# Patient Record
Sex: Female | Born: 1975 | Race: White | Hispanic: No | Marital: Married | Smoking: Former smoker
Health system: Southern US, Community
[De-identification: ages and names within clinical notes are randomized; demographics above are authoritative.]

## PROBLEM LIST (undated history)

## (undated) DIAGNOSIS — F419 Anxiety disorder, unspecified: Secondary | ICD-10-CM

## (undated) DIAGNOSIS — Z8616 Personal history of COVID-19: Secondary | ICD-10-CM

## (undated) DIAGNOSIS — I2510 Atherosclerotic heart disease of native coronary artery without angina pectoris: Secondary | ICD-10-CM

## (undated) DIAGNOSIS — Z789 Other specified health status: Secondary | ICD-10-CM

## (undated) DIAGNOSIS — I10 Essential (primary) hypertension: Secondary | ICD-10-CM

## (undated) HISTORY — DX: Personal history of COVID-19: Z86.16

## (undated) HISTORY — PX: CARPAL TUNNEL RELEASE: SHX101

## (undated) HISTORY — PX: TONSILLECTOMY: SUR1361

## (undated) HISTORY — DX: Atherosclerotic heart disease of native coronary artery without angina pectoris: I25.10

## (undated) HISTORY — DX: Anxiety disorder, unspecified: F41.9

## (undated) HISTORY — DX: Essential (primary) hypertension: I10

---

## 1999-10-29 ENCOUNTER — Other Ambulatory Visit: Admission: RE | Admit: 1999-10-29 | Discharge: 1999-10-29 | Payer: Self-pay | Admitting: Obstetrics and Gynecology

## 2001-02-23 ENCOUNTER — Emergency Department (HOSPITAL_COMMUNITY): Admission: EM | Admit: 2001-02-23 | Discharge: 2001-02-23 | Payer: Self-pay

## 2001-07-26 ENCOUNTER — Emergency Department (HOSPITAL_COMMUNITY): Admission: EM | Admit: 2001-07-26 | Discharge: 2001-07-27 | Payer: Self-pay

## 2001-09-13 ENCOUNTER — Emergency Department (HOSPITAL_COMMUNITY): Admission: EM | Admit: 2001-09-13 | Discharge: 2001-09-13 | Payer: Self-pay | Admitting: Emergency Medicine

## 2001-09-30 ENCOUNTER — Encounter: Payer: Self-pay | Admitting: Emergency Medicine

## 2001-09-30 ENCOUNTER — Emergency Department (HOSPITAL_COMMUNITY): Admission: EM | Admit: 2001-09-30 | Discharge: 2001-09-30 | Payer: Self-pay | Admitting: Emergency Medicine

## 2002-04-14 ENCOUNTER — Emergency Department (HOSPITAL_COMMUNITY): Admission: EM | Admit: 2002-04-14 | Discharge: 2002-04-14 | Payer: Self-pay | Admitting: Unknown Physician Specialty

## 2003-05-11 ENCOUNTER — Emergency Department (HOSPITAL_COMMUNITY): Admission: AD | Admit: 2003-05-11 | Discharge: 2003-05-11 | Payer: Self-pay | Admitting: Family Medicine

## 2003-12-30 ENCOUNTER — Emergency Department (HOSPITAL_COMMUNITY): Admission: EM | Admit: 2003-12-30 | Discharge: 2003-12-30 | Payer: Self-pay | Admitting: Emergency Medicine

## 2005-03-12 ENCOUNTER — Emergency Department (HOSPITAL_COMMUNITY): Admission: EM | Admit: 2005-03-12 | Discharge: 2005-03-12 | Payer: Self-pay | Admitting: Emergency Medicine

## 2007-08-03 ENCOUNTER — Ambulatory Visit (HOSPITAL_COMMUNITY): Admission: RE | Admit: 2007-08-03 | Discharge: 2007-08-03 | Payer: Self-pay | Admitting: Family Medicine

## 2008-11-13 ENCOUNTER — Ambulatory Visit (HOSPITAL_BASED_OUTPATIENT_CLINIC_OR_DEPARTMENT_OTHER): Admission: RE | Admit: 2008-11-13 | Discharge: 2008-11-13 | Payer: Self-pay | Admitting: Orthopedic Surgery

## 2009-08-14 ENCOUNTER — Emergency Department (HOSPITAL_COMMUNITY): Admission: EM | Admit: 2009-08-14 | Discharge: 2009-08-14 | Payer: Self-pay | Admitting: Dermatology

## 2010-06-10 ENCOUNTER — Ambulatory Visit (HOSPITAL_BASED_OUTPATIENT_CLINIC_OR_DEPARTMENT_OTHER): Admission: RE | Admit: 2010-06-10 | Discharge: 2010-06-10 | Payer: Self-pay | Admitting: Orthopedic Surgery

## 2010-09-30 LAB — POCT HEMOGLOBIN-HEMACUE: Hemoglobin: 12.9 g/dL (ref 12.0–15.0)

## 2010-12-02 NOTE — Op Note (Signed)
[name], [name]                ACCOUNT NO.:  [id number]   MEDICAL RECORD NO.:  [id number]          PATIENT TYPE:  AMB   LOCATION:  DSC                          FACILITY:  [facility]   PHYSICIAN:  Katy Fitch. Sypher, M.D. 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   OPERATION:  Release of left transverse carpal ligament.   SURGEON:  Katy Fitch. Sypher, MD   ASSISTANT:  None.   ANESTHESIA:  General by LMA.   SUPERVISING ANESTHESIOLOGIST:  Sheldon Silvan, MD   INDICATIONS:  Stephanie Harrison is a 35 year old woman referred for  evaluation of hand numbness.  Clinical examination revealed signs of  probable bilateral carpal tunnel syndrome and electrodiagnostic studies  in December 2009 confirmed bilateral carpal tunnel syndrome.   She has been treated in a nonoperative manner for more than 4 months  with activity modification, splinting and steroid injection.  Despite  our best conservative efforts, she still has significant entrapment  neuropathy symptoms.  She now presents for release of her left  transverse carpal ligament.   Preoperatively, she was advised of the potential risks and benefits of  surgery.  She is brought to the operating room at this time.   PROCEDURE:  Stephanie Harrison is brought to the operating room and placed in  supine position upon operating table.   Following the induction of general anesthesia by LMA technique, the left  arm was prepped with Betadine soap solution and sterilely draped.  Upon  exsanguination of the left arm with an Esmarch bandage the arterial  tourniquet was inflated to 230 mmHg.  The procedure commenced with a  short incision in the line of ring finger and the palm.  Subcutaneous  tissues were  carefully divided revealing the palmar fascia.  This was  split longitudinally to reveal the common sensory branch of the median  nerve.  These were followed back to the transverse carpal ligament which  was gently isolated from the median nerve.  The ligament was then  released with scissors along its ulnar border extending into the distal  forearm.  This widely opened the carpal canal.  The palmaris longus was  left intact.   Bleeding points along the margin of the released ligament were  electrocauterized with bipolar current followed by repair of the skin  with intradermal 3-0 Prolene suture.  Lidocaine 2% was infiltrated for  postoperative analgesia.  Steri-Strips were applied to the wound.  The  wound was then dressed with sterile gauze, sterile Webril and a volar  plaster splint was applied maintaining the wrist in 5 degrees of  dorsiflexion.   There were no apparent complications.   Stephanie Harrison tolerated the surgery and anesthesia well.  She was  transferred to the recovery room with stable signs.      Katy Fitch Sypher, M.D.  Electronically Signed     RVS/MEDQ  D:  11/13/2008  T:  11/13/2008  Job:  [id number]

## 2011-02-09 ENCOUNTER — Emergency Department (HOSPITAL_COMMUNITY)
Admission: EM | Admit: 2011-02-09 | Discharge: 2011-02-09 | Discharge status: Against Medical Advice | Payer: BC Managed Care – PPO | Attending: Emergency Medicine | Admitting: Emergency Medicine

## 2011-02-09 DIAGNOSIS — W269XXA Contact with unspecified sharp object(s), initial encounter: Secondary | ICD-10-CM | POA: Insufficient documentation

## 2011-02-09 DIAGNOSIS — S61209A Unspecified open wound of unspecified finger without damage to nail, initial encounter: Secondary | ICD-10-CM | POA: Insufficient documentation

## 2012-02-04 ENCOUNTER — Other Ambulatory Visit: Payer: Self-pay | Admitting: Nurse Practitioner

## 2012-02-04 DIAGNOSIS — N920 Excessive and frequent menstruation with regular cycle: Secondary | ICD-10-CM

## 2012-02-09 ENCOUNTER — Ambulatory Visit (HOSPITAL_COMMUNITY)
Admission: RE | Admit: 2012-02-09 | Discharge: 2012-02-09 | Disposition: A | Discharge status: Home / Self Care | Payer: BC Managed Care – PPO | Source: Ambulatory Visit | Attending: Nurse Practitioner | Admitting: Nurse Practitioner

## 2012-02-09 DIAGNOSIS — N920 Excessive and frequent menstruation with regular cycle: Secondary | ICD-10-CM

## 2012-02-09 DIAGNOSIS — N949 Unspecified condition associated with female genital organs and menstrual cycle: Secondary | ICD-10-CM | POA: Insufficient documentation

## 2012-02-09 DIAGNOSIS — N938 Other specified abnormal uterine and vaginal bleeding: Secondary | ICD-10-CM | POA: Insufficient documentation

## 2012-03-09 ENCOUNTER — Other Ambulatory Visit: Payer: Self-pay | Admitting: Obstetrics & Gynecology

## 2012-03-25 ENCOUNTER — Encounter (HOSPITAL_COMMUNITY): Payer: Self-pay | Admitting: Pharmacist

## 2012-04-04 ENCOUNTER — Inpatient Hospital Stay (HOSPITAL_COMMUNITY): Admission: RE | Admit: 2012-04-04 | Payer: BC Managed Care – PPO | Source: Ambulatory Visit

## 2012-04-05 ENCOUNTER — Encounter (HOSPITAL_COMMUNITY): Payer: Self-pay

## 2012-04-05 ENCOUNTER — Encounter (HOSPITAL_COMMUNITY)
Admission: RE | Admit: 2012-04-05 | Discharge: 2012-04-05 | Disposition: A | Discharge status: Home / Self Care | Payer: BC Managed Care – PPO | Source: Ambulatory Visit | Attending: Obstetrics & Gynecology | Admitting: Obstetrics & Gynecology

## 2012-04-05 HISTORY — DX: Other specified health status: Z78.9

## 2012-04-05 LAB — CBC
Hemoglobin: 12.3 g/dL (ref 12.0–15.0)
MCH: 29.6 pg (ref 26.0–34.0)
MCHC: 33.3 g/dL (ref 30.0–36.0)

## 2012-04-05 LAB — SURGICAL PCR SCREEN: MRSA, PCR: NEGATIVE

## 2012-04-05 NOTE — Patient Instructions (Signed)
Your procedure is scheduled on:04/08/12  Enter through the Main Entrance at :1130 am Pick up desk phone and dial [phone] and inform us of your arrival.  Please call [phone] if you have any problems the morning of surgery.  Remember: Do not eat after midnight:Thursday Do not drink after: 9am Fri  Take these meds the morning of surgery with a sip of water:none  DO NOT wear jewelry, eye make-up, lipstick,body lotion, or dark fingernail polish. Do not shave for 48 hours prior to surgery.  If you are to be admitted after surgery, leave suitcase in car until your room has been assigned. Patients discharged on the day of surgery will not be allowed to drive home.   Remember to use your Hibiclens as instructed.

## 2012-04-08 ENCOUNTER — Encounter (HOSPITAL_COMMUNITY): Payer: Self-pay | Admitting: Anesthesiology

## 2012-04-08 ENCOUNTER — Ambulatory Visit (HOSPITAL_COMMUNITY)
Admission: RE | Admit: 2012-04-08 | Discharge: 2012-04-08 | Disposition: A | Discharge status: Home / Self Care | Payer: BC Managed Care – PPO | Source: Ambulatory Visit | Attending: Obstetrics & Gynecology | Admitting: Obstetrics & Gynecology

## 2012-04-08 ENCOUNTER — Ambulatory Visit (HOSPITAL_COMMUNITY): Payer: BC Managed Care – PPO | Admitting: Anesthesiology

## 2012-04-08 ENCOUNTER — Encounter (HOSPITAL_COMMUNITY)
Admission: RE | Disposition: A | Discharge status: Home / Self Care | Payer: Self-pay | Source: Ambulatory Visit | Attending: Obstetrics & Gynecology

## 2012-04-08 DIAGNOSIS — N949 Unspecified condition associated with female genital organs and menstrual cycle: Secondary | ICD-10-CM | POA: Insufficient documentation

## 2012-04-08 DIAGNOSIS — N939 Abnormal uterine and vaginal bleeding, unspecified: Secondary | ICD-10-CM | POA: Diagnosis present

## 2012-04-08 DIAGNOSIS — N938 Other specified abnormal uterine and vaginal bleeding: Secondary | ICD-10-CM | POA: Insufficient documentation

## 2012-04-08 DIAGNOSIS — Z01818 Encounter for other preprocedural examination: Secondary | ICD-10-CM | POA: Insufficient documentation

## 2012-04-08 DIAGNOSIS — Z302 Encounter for sterilization: Secondary | ICD-10-CM | POA: Insufficient documentation

## 2012-04-08 DIAGNOSIS — Z641 Problems related to multiparity: Secondary | ICD-10-CM | POA: Insufficient documentation

## 2012-04-08 DIAGNOSIS — Z01812 Encounter for preprocedural laboratory examination: Secondary | ICD-10-CM | POA: Insufficient documentation

## 2012-04-08 HISTORY — PX: LAPAROSCOPIC TUBAL LIGATION: SHX1937

## 2012-04-08 SURGERY — LIGATION, FALLOPIAN TUBE, LAPAROSCOPIC
Anesthesia: General | Site: Uterus | Wound class: Clean Contaminated

## 2012-04-08 MED ORDER — ROCURONIUM BROMIDE 100 MG/10ML IV SOLN
INTRAVENOUS | Status: DC | PRN
  Administered 2012-04-08: 15 mg via INTRAVENOUS
  Administered 2012-04-08: 35 mg via INTRAVENOUS

## 2012-04-08 MED ORDER — NEOSTIGMINE METHYLSULFATE 1 MG/ML IJ SOLN
INTRAMUSCULAR | Status: AC
  Filled 2012-04-08: qty 10

## 2012-04-08 MED ORDER — LACTATED RINGERS IV SOLN
INTRAVENOUS | Status: DC | PRN
  Administered 2012-04-08: 500 mL via INTRAUTERINE

## 2012-04-08 MED ORDER — ROCURONIUM BROMIDE 50 MG/5ML IV SOLN
INTRAVENOUS | Status: AC
  Filled 2012-04-08: qty 1

## 2012-04-08 MED ORDER — LIDOCAINE HCL (CARDIAC) 20 MG/ML IV SOLN
INTRAVENOUS | Status: AC
  Filled 2012-04-08: qty 5

## 2012-04-08 MED ORDER — MIDAZOLAM HCL 5 MG/5ML IJ SOLN
INTRAMUSCULAR | Status: DC | PRN
  Administered 2012-04-08: 2 mg via INTRAVENOUS

## 2012-04-08 MED ORDER — ONDANSETRON HCL 4 MG/2ML IJ SOLN: 4.0000 mg | Freq: Four times a day (QID) | INTRAMUSCULAR | Status: DC | PRN

## 2012-04-08 MED ORDER — PROPOFOL 10 MG/ML IV EMUL
INTRAVENOUS | Status: AC
  Filled 2012-04-08: qty 20

## 2012-04-08 MED ORDER — FENTANYL CITRATE 0.05 MG/ML IJ SOLN
INTRAMUSCULAR | Status: AC
  Filled 2012-04-08: qty 5

## 2012-04-08 MED ORDER — DEXAMETHASONE SODIUM PHOSPHATE 4 MG/ML IJ SOLN
INTRAMUSCULAR | Status: DC | PRN
  Administered 2012-04-08: 10 mg via INTRAVENOUS

## 2012-04-08 MED ORDER — DEXAMETHASONE SODIUM PHOSPHATE 4 MG/ML IJ SOLN: INTRAMUSCULAR | Status: DC | PRN

## 2012-04-08 MED ORDER — FENTANYL CITRATE 0.05 MG/ML IJ SOLN
INTRAMUSCULAR | Status: DC | PRN
  Administered 2012-04-08 (×7): 50 ug via INTRAVENOUS

## 2012-04-08 MED ORDER — KETOROLAC TROMETHAMINE 30 MG/ML IJ SOLN
INTRAMUSCULAR | Status: DC | PRN
  Administered 2012-04-08: 30 mg via INTRAVENOUS

## 2012-04-08 MED ORDER — ACETAMINOPHEN 650 MG RE SUPP
650.0000 mg | RECTAL | Status: DC | PRN
  Filled 2012-04-08: qty 1

## 2012-04-08 MED ORDER — ONDANSETRON HCL 4 MG/2ML IJ SOLN
INTRAMUSCULAR | Status: AC
  Filled 2012-04-08: qty 2

## 2012-04-08 MED ORDER — LACTATED RINGERS IV SOLN
INTRAVENOUS | Status: DC
  Administered 2012-04-08 (×4): via INTRAVENOUS

## 2012-04-08 MED ORDER — PROPOFOL 10 MG/ML IV EMUL
INTRAVENOUS | Status: DC | PRN
  Administered 2012-04-08: 50 mg via INTRAVENOUS
  Administered 2012-04-08: 200 mg via INTRAVENOUS

## 2012-04-08 MED ORDER — KETOROLAC TROMETHAMINE 30 MG/ML IJ SOLN
INTRAMUSCULAR | Status: AC
  Filled 2012-04-08: qty 1

## 2012-04-08 MED ORDER — FENTANYL CITRATE 0.05 MG/ML IJ SOLN
INTRAMUSCULAR | Status: AC
  Filled 2012-04-08: qty 2

## 2012-04-08 MED ORDER — FENTANYL CITRATE 0.05 MG/ML IJ SOLN
INTRAMUSCULAR | Status: AC
  Administered 2012-04-08: 50 ug via INTRAVENOUS
  Filled 2012-04-08: qty 2

## 2012-04-08 MED ORDER — OXYCODONE-ACETAMINOPHEN 5-325 MG PO TABS: 2.0000 | ORAL_TABLET | Freq: Four times a day (QID) | ORAL | Status: DC | PRN

## 2012-04-08 MED ORDER — LIDOCAINE HCL (CARDIAC) 20 MG/ML IV SOLN
INTRAVENOUS | Status: DC | PRN
  Administered 2012-04-08 (×2): 20 mg via INTRAVENOUS
  Administered 2012-04-08: 30 mg via INTRAVENOUS

## 2012-04-08 MED ORDER — ACETAMINOPHEN 325 MG PO TABS: 650.0000 mg | ORAL_TABLET | ORAL | Status: DC | PRN

## 2012-04-08 MED ORDER — GLYCOPYRROLATE 0.2 MG/ML IJ SOLN
INTRAMUSCULAR | Status: AC
  Filled 2012-04-08: qty 1

## 2012-04-08 MED ORDER — OXYCODONE HCL 5 MG PO TABS: 5.0000 mg | ORAL_TABLET | ORAL | Status: DC | PRN

## 2012-04-08 MED ORDER — LIDOCAINE HCL 1 % IJ SOLN
INTRAMUSCULAR | Status: DC | PRN
  Administered 2012-04-08: 10 mL

## 2012-04-08 MED ORDER — BUPIVACAINE HCL (PF) 0.25 % IJ SOLN
INTRAMUSCULAR | Status: AC
  Filled 2012-04-08: qty 30

## 2012-04-08 MED ORDER — FENTANYL CITRATE 0.05 MG/ML IJ SOLN: 25.0000 ug | INTRAMUSCULAR | Status: DC | PRN

## 2012-04-08 MED ORDER — ONDANSETRON HCL 4 MG/2ML IJ SOLN
INTRAMUSCULAR | Status: DC | PRN
  Administered 2012-04-08: 4 mg via INTRAVENOUS

## 2012-04-08 MED ORDER — NEOSTIGMINE METHYLSULFATE 1 MG/ML IJ SOLN
INTRAMUSCULAR | Status: DC | PRN
  Administered 2012-04-08: 4 mg via INTRAVENOUS

## 2012-04-08 MED ORDER — GLYCOPYRROLATE 0.2 MG/ML IJ SOLN
INTRAMUSCULAR | Status: DC | PRN
  Administered 2012-04-08: 0.6 mg via INTRAVENOUS

## 2012-04-08 MED ORDER — ESMOLOL HCL 10 MG/ML IV SOLN
INTRAVENOUS | Status: AC
  Filled 2012-04-08: qty 10

## 2012-04-08 MED ORDER — SODIUM CHLORIDE 0.9 % IJ SOLN: 3.0000 mL | INTRAMUSCULAR | Status: DC | PRN

## 2012-04-08 MED ORDER — MIDAZOLAM HCL 2 MG/2ML IJ SOLN
INTRAMUSCULAR | Status: AC
  Filled 2012-04-08: qty 2

## 2012-04-08 MED ORDER — OXYCODONE-ACETAMINOPHEN 5-325 MG PO TABS
ORAL_TABLET | ORAL | Status: AC
  Administered 2012-04-08: 1
  Filled 2012-04-08: qty 1

## 2012-04-08 MED ORDER — FENTANYL CITRATE 0.05 MG/ML IJ SOLN
25.0000 ug | INTRAMUSCULAR | Status: DC | PRN
  Administered 2012-04-08 (×4): 50 ug via INTRAVENOUS

## 2012-04-08 MED ORDER — BUPIVACAINE HCL (PF) 0.25 % IJ SOLN
INTRAMUSCULAR | Status: DC | PRN
  Administered 2012-04-08: 4 mL

## 2012-04-08 MED ORDER — DEXAMETHASONE SODIUM PHOSPHATE 10 MG/ML IJ SOLN
INTRAMUSCULAR | Status: AC
  Filled 2012-04-08: qty 1

## 2012-04-08 SURGICAL SUPPLY — 27 items
ABLATOR ENDOMETRIAL BIPOLAR (ABLATOR) ×3 IMPLANT
ADH SKN CLS APL DERMABOND .7 (GAUZE/BANDAGES/DRESSINGS) ×2
CANISTER SUCTION 2500CC (MISCELLANEOUS) ×3 IMPLANT
CATH ROBINSON RED A/P 16FR (CATHETERS) ×3 IMPLANT
CHLORAPREP W/TINT 26ML (MISCELLANEOUS) ×3 IMPLANT
CLOTH BEACON ORANGE TIMEOUT ST (SAFETY) ×3 IMPLANT
CONTAINER PREFILL 10% NBF 60ML (FORM) ×6 IMPLANT
COVER TABLE BACK 60X90 (DRAPES) ×1 IMPLANT
DERMABOND ADVANCED (GAUZE/BANDAGES/DRESSINGS) ×1
DERMABOND ADVANCED .7 DNX12 (GAUZE/BANDAGES/DRESSINGS) ×2 IMPLANT
DRESSING TELFA 8X3 (GAUZE/BANDAGES/DRESSINGS) ×3 IMPLANT
GLOVE BIO SURGEON STRL SZ 6.5 (GLOVE) ×6 IMPLANT
GOWN PREVENTION PLUS LG XLONG (DISPOSABLE) ×6 IMPLANT
NDL SPNL 20GX3.5 QUINCKE YW (NEEDLE) IMPLANT
NEEDLE SPNL 20GX3.5 QUINCKE YW (NEEDLE) ×3 IMPLANT
PACK LAPAROSCOPY BASIN (CUSTOM PROCEDURE TRAY) ×3 IMPLANT
PAD OB MATERNITY 4.3X12.25 (PERSONAL CARE ITEMS) ×3 IMPLANT
SUT VIC AB 3-0 PS2 18 (SUTURE)
SUT VIC AB 3-0 PS2 18XBRD (SUTURE) IMPLANT
SUT VICRYL 0 UR6 27IN ABS (SUTURE) ×2 IMPLANT
SYR CONTROL 10ML LL (SYRINGE) ×1 IMPLANT
TOWEL OR 17X24 6PK STRL BLUE (TOWEL DISPOSABLE) ×7 IMPLANT
TROCAR BALLN 12MMX100 BLUNT (TROCAR) ×1 IMPLANT
TROCAR BALLN GELPORT 12X130M (ENDOMECHANICALS) ×1 IMPLANT
TROCAR XCEL NON-BLD 11X100MML (ENDOMECHANICALS) ×3 IMPLANT
TUBING HYDROFLEX HYSTEROSCOPY (TUBING) ×1 IMPLANT
WATER STERILE IRR 1000ML POUR (IV SOLUTION) ×3 IMPLANT

## 2012-04-08 NOTE — Anesthesia Preprocedure Evaluation (Signed)

## 2012-04-08 NOTE — H&P (Signed)
  Chief Complaint: 36 y.o.  who presents for a laparoscopic BTL/Novasure ablation  Details of Present Illness: The patient gives a h/o AUB.  BP 151/96  Pulse 82  Temp 98.1 F (36.7 C) (Oral)  Resp 18  Ht 5\' 8"  (1.727 m)  Wt 118.389 kg (261 lb)  BMI 39.68 kg/m2  SpO2 100%  Past Medical History  Diagnosis Date  . No pertinent past medical history    History   Social History  . Marital Status: Married    Spouse Name: N/A    Number of Children: N/A  . Years of Education: N/A   Occupational History  . Not on file.   Social History Main Topics  . Smoking status: Current Every Day Smoker -- 0.2 packs/day    Types: Cigarettes  . Smokeless tobacco: Not on file  . Alcohol Use: Yes     socially  . Drug Use: No  . Sexually Active:    Other Topics Concern  . Not on file   Social History Narrative  . No narrative on file   No family history on file.  Pertinent items are noted in HPI.  Pre-Op Diagnosis: desire sterilization;aub   Planned Procedure: Procedure(s): LAPAROSCOPIC TUBAL LIGATION DILATATION & CURETTAGE/HYSTEROSCOPY WITH NOVASURE ABLATION  I have reviewed the patient's history and have completed the physical exam and Stephanie Harrison is acceptable for surgery.  Stephanie Rainbow, MD 04/08/2012 12:48 PM

## 2012-04-08 NOTE — Op Note (Signed)
Procedure Note  Stephanie Harrison 36 y.o. 04/08/2012  Preoperative Diagnosis:  Multiparity, desires a sterilization procedure; history of abnormal uterine bleeding  Postoperative Diagnosis: Same  Procedure: Laparoscopic bilateral tubal ligation with fulguration; Novasure endometrial ablation with dilatation and curettage, hysteroscopy  Surgeon: Antionette Char A  Indications:  The patient now presents for a sterilization/endometrial ablation procedure(s) after discussing therapeutic alternatives.        Procedure Detail:  The patient was taken to the operating room and was placed on the operating table in the dorsal supine position.  After his satisfactory general anesthesia was achieved, the patient was placed in the semi-lithotomy position using Allen stirrups. The patient was prepped and draped in the usual sterile manner for vaginal laparoscopic procedure. A speculum was placed in the vagina. The anterior lip of the cervix was grasped with a single-tooth tenaculum. A Hulka manipulator was then advanced into the uterus and secured  to the anterior lip of the cervix as a means to manipulate the uterus. The single-tooth tenaculum and Hulka manipulator were then removed. The infraumbilical region was then anesthetized with local anesthesia, 0.25%  Marcaine. A small incision was made to the skin and subcutaneous tissue. An attempt was made to introduce a  10 mm Optiview trocar into the abdominal cavity.  As this was unsuccessful, a Hassan trocar was placed through the incision into the abdominal cavity.  A diagnostic laparoscope with video camera attached was placed through the trocar sleeve and carbon dioxide was used to insufflate the abdominal and pelvic cavity. The pelvic contents were examined.  The tubes, uterus and right ovary were normal in appearance.  A left-sided, 2- 3 cm, smooth-walled, ovarian cyst was noted.  Kleppinger bipolar forceps were inserted through the operative port of the  laparoscope. The left fallopian tube was identified and traced out to its fimbriated end. Then starting at the distal isthmus the proximal ampullary portion of the tube was coagulated in 4 contiguous areas. Each time the resistance meter went to 0 and the tube had been retracted away from an adjacent viscera. The right fallopian tube was then manipulated in a similar fashion. The scope was removed.   A speculum was placed in the vagina.  The anterior lip of the cervix was grasped with a single-toothed tenaculum.  The endocervical canal sounded to 4 cm.  The uterine cavity sounded to 8 cm.  The endocervical canal was dilated with Shawnie Pons dilators.  A 5 mm diagnostic hysteroscope with Glycine as the distending medium was used to perform a diagnostic hysteroscopy.  The hysteroscope was removed.  A small, Sims curette was used to perform an endometrial curettage.  The cervical canal was further dilated with Pratt dilators to a #29.  The Novasure device was test-fired and the meter went between 4.5 and 5.  The device was withdrawn into the cylinder and placed into the uterus.  The dorsal fin was set appropriately.  The device was deployed and then seated.  The sleeve was placed up against the endocervix.  A cavity assessment test was performed and passed.  The ablation cycle was completed.  The device was removed and another hysteroscopic examination was performed.  A good result was noted.  All the instruments were removed from the vagina.  Attention was turned back to the abdomen. The excess carbon dioxide was remove through the port, before it was removed.  The subcutaneous layer of the incision was reapproximated with an interrupted figure-of eight 0-Vicryl suture on a UR 6 needle. The  skin was reapproximated with running subcuticluar stitches of 4-0 Vicryl suture. Final sponge, instrument and needle counts were correct. The patient was awakened on the operating table and taken to the PACU in satisfactory  condition.    Findings:  See above.  Visualization at hysteroscopy was very limited.

## 2012-04-08 NOTE — Transfer of Care (Signed)
Immediate Anesthesia Transfer of Care Note  Patient: Stephanie Harrison  Procedure(s) Performed: Procedure(s) (LRB) with comments: LAPAROSCOPIC TUBAL LIGATION (Bilateral) DILATATION & CURETTAGE/HYSTEROSCOPY WITH NOVASURE ABLATION (N/A)  Patient Location: PACU  Anesthesia Type: General  Level of Consciousness: awake  Airway & Oxygen Therapy: Patient Spontanous Breathing and Patient connected to nasal cannula oxygen  Post-op Assessment: Report given to PACU RN and Post -op Vital signs reviewed and stable  Post vital signs: stable  Complications: No apparent anesthesia complications

## 2012-04-08 NOTE — Anesthesia Postprocedure Evaluation (Signed)
  Anesthesia Post-op Note  Patient: Stephanie Harrison  Procedure(s) Performed: Procedure(s) (LRB) with comments: LAPAROSCOPIC TUBAL LIGATION (Bilateral) DILATATION & CURETTAGE/HYSTEROSCOPY WITH NOVASURE ABLATION (N/A)  Patient Location: PACU  Anesthesia Type: General  Level of Consciousness: awake, alert  and oriented  Airway and Oxygen Therapy: Patient Spontanous Breathing  Post-op Pain: mild  Post-op Assessment: Post-op Vital signs reviewed, Patient's Cardiovascular Status Stable, Respiratory Function Stable, Patent Airway, No signs of Nausea or vomiting and Pain level controlled  Post-op Vital Signs: Reviewed and stable  Complications: No apparent anesthesia complications

## 2012-04-10 ENCOUNTER — Encounter (HOSPITAL_COMMUNITY): Payer: Self-pay | Admitting: Obstetrics & Gynecology

## 2014-01-04 ENCOUNTER — Other Ambulatory Visit: Payer: Self-pay

## 2014-01-04 NOTE — Telephone Encounter (Signed)
error 

## 2014-06-12 ENCOUNTER — Other Ambulatory Visit: Payer: Self-pay | Admitting: Family Medicine

## 2014-06-12 DIAGNOSIS — R10813 Right lower quadrant abdominal tenderness: Secondary | ICD-10-CM

## 2014-06-20 ENCOUNTER — Ambulatory Visit
Admission: RE | Admit: 2014-06-20 | Discharge: 2014-06-20 | Disposition: A | Discharge status: Home / Self Care | Payer: BC Managed Care – PPO | Source: Ambulatory Visit | Attending: Family Medicine | Admitting: Family Medicine

## 2014-06-20 DIAGNOSIS — R10813 Right lower quadrant abdominal tenderness: Secondary | ICD-10-CM

## 2014-06-20 MED ORDER — IOHEXOL 300 MG/ML  SOLN
125.0000 mL | Freq: Once | INTRAMUSCULAR | Status: AC | PRN
  Administered 2014-06-20: 125 mL via INTRAVENOUS

## 2016-02-19 ENCOUNTER — Other Ambulatory Visit: Payer: Self-pay | Admitting: Family Medicine

## 2016-02-19 DIAGNOSIS — Z1231 Encounter for screening mammogram for malignant neoplasm of breast: Secondary | ICD-10-CM

## 2016-02-26 ENCOUNTER — Other Ambulatory Visit: Payer: Self-pay | Admitting: Family Medicine

## 2016-02-26 DIAGNOSIS — M5440 Lumbago with sciatica, unspecified side: Secondary | ICD-10-CM

## 2016-02-26 DIAGNOSIS — M47816 Spondylosis without myelopathy or radiculopathy, lumbar region: Secondary | ICD-10-CM

## 2016-02-28 ENCOUNTER — Ambulatory Visit
Admission: RE | Admit: 2016-02-28 | Discharge: 2016-02-28 | Disposition: A | Discharge status: Home / Self Care | Payer: Managed Care, Other (non HMO) | Source: Ambulatory Visit | Attending: Family Medicine | Admitting: Family Medicine

## 2016-02-28 DIAGNOSIS — Z1231 Encounter for screening mammogram for malignant neoplasm of breast: Secondary | ICD-10-CM

## 2016-03-06 ENCOUNTER — Ambulatory Visit
Admission: RE | Admit: 2016-03-06 | Discharge: 2016-03-06 | Disposition: A | Discharge status: Home / Self Care | Payer: Managed Care, Other (non HMO) | Source: Ambulatory Visit | Attending: Family Medicine | Admitting: Family Medicine

## 2016-03-06 DIAGNOSIS — M47816 Spondylosis without myelopathy or radiculopathy, lumbar region: Secondary | ICD-10-CM

## 2016-03-06 DIAGNOSIS — M5440 Lumbago with sciatica, unspecified side: Secondary | ICD-10-CM

## 2016-06-04 ENCOUNTER — Other Ambulatory Visit: Payer: Self-pay | Admitting: Orthopedic Surgery

## 2016-06-04 DIAGNOSIS — R52 Pain, unspecified: Secondary | ICD-10-CM

## 2016-06-26 ENCOUNTER — Inpatient Hospital Stay: Admission: RE | Admit: 2016-06-26 | Payer: Managed Care, Other (non HMO) | Source: Ambulatory Visit

## 2016-06-26 ENCOUNTER — Other Ambulatory Visit: Payer: Managed Care, Other (non HMO)

## 2018-01-10 ENCOUNTER — Ambulatory Visit (INDEPENDENT_AMBULATORY_CARE_PROVIDER_SITE_OTHER): Payer: 59 | Admitting: Obstetrics and Gynecology

## 2018-01-10 ENCOUNTER — Encounter: Payer: Self-pay | Admitting: Obstetrics and Gynecology

## 2018-01-10 VITALS — BP 139/81 | HR 106 | Ht 68.0 in | Wt 277.5 lb

## 2018-01-10 DIAGNOSIS — R102 Pelvic and perineal pain: Secondary | ICD-10-CM | POA: Diagnosis not present

## 2018-01-10 DIAGNOSIS — R3915 Urgency of urination: Secondary | ICD-10-CM

## 2018-01-10 DIAGNOSIS — N939 Abnormal uterine and vaginal bleeding, unspecified: Secondary | ICD-10-CM | POA: Diagnosis not present

## 2018-01-10 LAB — POCT URINALYSIS DIPSTICK
Bilirubin, UA: NEGATIVE
GLUCOSE UA: NEGATIVE
Ketones, UA: NEGATIVE
LEUKOCYTES UA: NEGATIVE
NITRITE UA: NEGATIVE
PROTEIN UA: NEGATIVE
SPEC GRAV UA: 1.015 (ref 1.010–1.025)
Urobilinogen, UA: 0.2 E.U./dL
pH, UA: 7 (ref 5.0–8.0)

## 2018-01-10 NOTE — Progress Notes (Signed)
Pt c/o dysmenorrhea with periods. Periods are extremely painful and lasts 2 days. Pt also c/o urinary urgency x 1 wk.

## 2018-01-10 NOTE — Progress Notes (Signed)
GYNECOLOGY OFFICE VISIT NOTE  History:  42 y.o. G0P0000 here today for increased bleeding. She had endometrial ablation in 2013, didn't have bleeding until recently. Started having bleeding again 2 months ago. Has had 2 episodes bleeding lasting about 3 days each, moderate bleeding to begin with and then lighter. Has also had bad cramping that has been irregular, happens before the bleeding comes.   Also with urinary frequency, dysuria for about a month.   Past Medical History:  Diagnosis Date  . No pertinent past medical history     Past Surgical History:  Procedure Laterality Date  . CARPAL TUNNEL RELEASE     bil wrists  . LAPAROSCOPIC TUBAL LIGATION  04/08/2012   Procedure: LAPAROSCOPIC TUBAL LIGATION;  Surgeon: Antionette CharLisa Jackson-Moore, MD;  Location: [facility];  Service: Gynecology;  Laterality: Bilateral;  . TONSILLECTOMY       Current Outpatient Medications:  .  hydrochlorothiazide (HYDRODIURIL) 25 MG tablet, TAKE 1 TABLET BY MOUTH ONCE DAILY NEED  OFFICE  VISIT, Disp: , Rfl: 0 .  ibuprofen (ADVIL,MOTRIN) 600 MG tablet, Take 600 mg by mouth daily as needed. For cramps, Disp: , Rfl:  .  traMADol (ULTRAM) 50 MG tablet, Take by mouth., Disp: , Rfl:  .  Multiple Vitamin (MULTIVITAMIN WITH MINERALS) TABS, Take 1 tablet by mouth daily., Disp: , Rfl:  .  oxyCODONE-acetaminophen (PERCOCET) 5-325 MG per tablet, Take 2 tablets by mouth every 6 (six) hours as needed for pain. (Patient not taking: Reported on 01/10/2018), Disp: 30 tablet, Rfl: 0  The following portions of the patient's history were reviewed and updated as appropriate: allergies, current medications, past family history, past medical history, past social history, past surgical history and problem list.   Health Maintenance:  Last pap: per patient, was about a year ago, was normal, done at PCP Last mammogram: over a year ago, was normal  Review of Systems:  Pertinent items noted in HPI and remainder of comprehensive ROS  otherwise negative.   Objective:  Physical Exam BP 139/81   Pulse (!) 106   Ht 5\' 8"  (1.727 m)   Wt 277 lb 8 oz (125.9 kg)   LMP  (LMP Unknown) Comment: Last LMP 2 wks ago  BMI 42.19 kg/m  CONSTITUTIONAL: Well-developed, well-nourished female in no acute distress.  SKIN: Skin is warm and dry. No rash noted. Not diaphoretic. No erythema. No pallor. NEUROLOGIC: Alert and oriented to person, place, and time. Normal reflexes, muscle tone coordination. No cranial nerve deficit noted. PSYCHIATRIC: Normal mood and affect. Normal behavior. Normal judgment and thought content. ABDOMEN: Soft, no distention noted.   PELVIC: normal appearing external female genitalia, normal appearing cervix with scant white discharge, some mild pelvic pain with palpation, no palpable uterus or adnexal masses MUSCULOSKELETAL: Normal range of motion. No edema noted.  Labs and Imaging No results found.  Assessment & Plan:   1. Abnormal uterine bleeding (AUB) - US PELVIC COMPLETE WITH TRANSVAGINAL; Future - likely 2/2 regrowth of endometrium, recommend EMB given h/o ablation Will obtain US first to characterize stripe and have her return for EMB  2. Urinary urgency - POCT Urinalysis Dipstick - Urine Culture  3. Pelvic pain See above     Routine preventative health maintenance measures emphasized. Please refer to After Visit Summary for other counseling recommendations.   Return in about 3 months (around 04/12/2018) for return after TVUS.    Baldemar LenisK. Meryl Janisse Ghan, M.D. Attending Obstetrician & Gynecologist, [facility], [facility]

## 2018-01-11 ENCOUNTER — Telehealth: Payer: Self-pay

## 2018-01-11 NOTE — Telephone Encounter (Signed)
Returned call, advised that labs have not resulted.

## 2018-01-12 LAB — URINE CULTURE

## 2018-01-21 ENCOUNTER — Inpatient Hospital Stay: Admission: RE | Admit: 2018-01-21 | Payer: 59 | Source: Ambulatory Visit

## 2018-01-28 ENCOUNTER — Ambulatory Visit
Admission: RE | Admit: 2018-01-28 | Discharge: 2018-01-28 | Disposition: A | Discharge status: Home / Self Care | Payer: 59 | Source: Ambulatory Visit | Attending: Obstetrics and Gynecology | Admitting: Obstetrics and Gynecology

## 2018-01-28 DIAGNOSIS — N939 Abnormal uterine and vaginal bleeding, unspecified: Secondary | ICD-10-CM

## 2018-02-03 ENCOUNTER — Ambulatory Visit: Payer: 59 | Admitting: Obstetrics and Gynecology

## 2018-02-03 ENCOUNTER — Encounter: Payer: Self-pay | Admitting: Obstetrics and Gynecology

## 2018-02-03 ENCOUNTER — Other Ambulatory Visit (HOSPITAL_COMMUNITY)
Admission: RE | Admit: 2018-02-03 | Discharge: 2018-02-03 | Disposition: A | Discharge status: Home / Self Care | Payer: 59 | Source: Ambulatory Visit | Attending: Obstetrics and Gynecology | Admitting: Obstetrics and Gynecology

## 2018-02-03 VITALS — BP 133/85 | HR 109 | Wt 274.0 lb

## 2018-02-03 DIAGNOSIS — Z124 Encounter for screening for malignant neoplasm of cervix: Secondary | ICD-10-CM | POA: Diagnosis not present

## 2018-02-03 DIAGNOSIS — N939 Abnormal uterine and vaginal bleeding, unspecified: Secondary | ICD-10-CM

## 2018-02-03 DIAGNOSIS — Z1151 Encounter for screening for human papillomavirus (HPV): Secondary | ICD-10-CM | POA: Diagnosis not present

## 2018-02-03 DIAGNOSIS — Z3202 Encounter for pregnancy test, result negative: Secondary | ICD-10-CM

## 2018-02-03 DIAGNOSIS — Z01812 Encounter for preprocedural laboratory examination: Secondary | ICD-10-CM

## 2018-02-03 LAB — POCT URINE PREGNANCY: Preg Test, Ur: NEGATIVE

## 2018-02-03 NOTE — Progress Notes (Signed)
ENDOMETRIAL BIOPSY      Stephanie Harrison is a 42 y.o. G0P0000 here for endometrial biopsy.  The indications for endometrial biopsy were reviewed.  Risks of the biopsy including cramping, bleeding, infection, uterine perforation, inadequate specimen and need for additional procedures were discussed. The patient states she understands and agrees to undergo procedure today. Consent was signed. Time out was performed.   Indications: bleeding after endometrial ablation Urine HCG: negative  A bivalve speculum was placed into the vagina and the cervix was easily visualized and was prepped with Betadine x2. A single-toothed tenaculum was placed on the anterior lip of the cervix to stabilize it. The 3 mm pipelle was introduced into the cervix without difficulty to a depth of 3 cm, and a very small amount of tissue was obtained and sent to pathology. This was repeated for a total of e passes. The instruments were removed from the patient's vagina. Minimal bleeding from the cervix at the tenaculum was noted, improved with silver nitrate.  Likely did not obtain passage into endometrial cavity.  The patient tolerated the procedure well. Routine post-procedure instructions were given to the patient.     Baldemar LenisK. Meryl Davis, M.D. [facility]

## 2018-02-09 ENCOUNTER — Encounter: Payer: Self-pay | Admitting: *Deleted

## 2018-02-10 LAB — CYTOLOGY - PAP
Diagnosis: NEGATIVE
HPV (WINDOPATH): NOT DETECTED

## 2018-08-16 ENCOUNTER — Other Ambulatory Visit: Payer: Self-pay | Admitting: Family Medicine

## 2018-08-16 DIAGNOSIS — N6320 Unspecified lump in the left breast, unspecified quadrant: Secondary | ICD-10-CM

## 2018-08-22 ENCOUNTER — Ambulatory Visit
Admission: RE | Admit: 2018-08-22 | Discharge: 2018-08-22 | Disposition: A | Discharge status: Home / Self Care | Payer: 59 | Source: Ambulatory Visit | Attending: Family Medicine | Admitting: Family Medicine

## 2018-08-22 DIAGNOSIS — N6320 Unspecified lump in the left breast, unspecified quadrant: Secondary | ICD-10-CM

## 2019-02-03 ENCOUNTER — Other Ambulatory Visit: Payer: Self-pay

## 2019-02-03 DIAGNOSIS — Z20822 Contact with and (suspected) exposure to covid-19: Secondary | ICD-10-CM

## 2019-02-08 ENCOUNTER — Telehealth: Payer: Self-pay | Admitting: Family Medicine

## 2019-02-08 LAB — NOVEL CORONAVIRUS, NAA: SARS-CoV-2, NAA: NOT DETECTED

## 2019-02-08 NOTE — Telephone Encounter (Signed)
General/Other - Results  °The patient was given negative Covid-19 results ° °

## 2019-05-25 ENCOUNTER — Encounter (INDEPENDENT_AMBULATORY_CARE_PROVIDER_SITE_OTHER): Payer: Self-pay

## 2019-05-25 ENCOUNTER — Other Ambulatory Visit: Payer: Self-pay

## 2019-05-25 ENCOUNTER — Ambulatory Visit (INDEPENDENT_AMBULATORY_CARE_PROVIDER_SITE_OTHER): Payer: 59 | Admitting: Otolaryngology

## 2019-05-25 ENCOUNTER — Encounter (INDEPENDENT_AMBULATORY_CARE_PROVIDER_SITE_OTHER): Payer: Self-pay | Admitting: Otolaryngology

## 2019-05-25 VITALS — Temp 97.3°F

## 2019-05-25 DIAGNOSIS — J0100 Acute maxillary sinusitis, unspecified: Secondary | ICD-10-CM

## 2019-05-25 DIAGNOSIS — H6502 Acute serous otitis media, left ear: Secondary | ICD-10-CM

## 2019-05-25 MED ORDER — CEFUROXIME AXETIL 500 MG PO TABS: 500.0000 mg | ORAL_TABLET | Freq: Two times a day (BID) | ORAL | 0 refills | Status: DC

## 2019-05-25 NOTE — Progress Notes (Signed)
HPI: Stephanie Harrison is a 43 y.o. female who returns today for evaluation of sinus problems and left ear pain and fullness. Patient states that for the last two weeks her sinuses have felt full with pressure. She states she has had yellow-green colored discharge during this time. Patient has tried Mucinex without relief. She uses Flonase daily at night. Patient also complains of her left ear being painful and full. She states hearing is decreased compared to the other side. She has history of bilateral ear infections with a bilateral M&T as a child and a left M&T as an adult. Of note, patient states that when she took Augmentin in the past, she broke out in hives.  Past Medical History:  Diagnosis Date  . No pertinent past medical history    Past Surgical History:  Procedure Laterality Date  . CARPAL TUNNEL RELEASE     bil wrists  . LAPAROSCOPIC TUBAL LIGATION  04/08/2012   Procedure: LAPAROSCOPIC TUBAL LIGATION;  Surgeon: Antionette Char, MD;  Location: [facility];  Service: Gynecology;  Laterality: Bilateral;  . TONSILLECTOMY     Social History   Socioeconomic History  . Marital status: Married    Spouse name: Not on file  . Number of children: Not on file  . Years of education: Not on file  . Highest education level: Not on file  Occupational History  . Not on file  Social Needs  . Financial resource strain: Not on file  . Food insecurity    Worry: Not on file    Inability: Not on file  . Transportation needs    Medical: Not on file    Non-medical: Not on file  Tobacco Use  . Smoking status: Former Smoker    Packs/day: 0.25    Years: 20.00    Pack years: 5.00    Types: Cigarettes    Start date: 42    Quit date: 2014    Years since quitting: 6.8  . Smokeless tobacco: Never Used  . Tobacco comment: Stopped 2014   Substance and Sexual Activity  . Alcohol use: Yes    Comment: socially  . Drug use: No  . Sexual activity: Yes    Birth control/protection: Surgical   Lifestyle  . Physical activity    Days per week: Not on file    Minutes per session: Not on file  . Stress: Not on file  Relationships  . Social Musician on phone: Not on file    Gets together: Not on file    Attends religious service: Not on file    Active member of club or organization: Not on file    Attends meetings of clubs or organizations: Not on file    Relationship status: Not on file  Other Topics Concern  . Not on file  Social History Narrative  . Not on file   Family History  Problem Relation Age of Onset  . Heart Problems Mother   . Hypertension Mother   . Hypertension Father   . Cirrhosis Father    No Known Allergies Prior to Admission medications   Medication Sig Start Date End Date Taking? Authorizing Provider  cefUROXime (CEFTIN) 500 MG tablet Take 1 tablet (500 mg total) by mouth 2 (two) times daily with a meal. 05/25/19   Drema Halon, MD  hydrochlorothiazide (HYDRODIURIL) 25 MG tablet TAKE 1 TABLET BY MOUTH ONCE DAILY NEED  OFFICE  VISIT 11/30/17   [provider]  ibuprofen (ADVIL,MOTRIN) 600  MG tablet Take 600 mg by mouth daily as needed. For cramps    [provider]  Multiple Vitamin (MULTIVITAMIN WITH MINERALS) TABS Take 1 tablet by mouth daily.    [provider]  oxyCODONE-acetaminophen (PERCOCET) 5-325 MG per tablet Take 2 tablets by mouth every 6 (six) hours as needed for pain. Patient not taking: Reported on 01/10/2018 04/08/12   Lahoma Crocker, MD  traMADol Veatrice Bourbon) 50 MG tablet Take by mouth. 08/11/17   [provider]     Positive ROS: positive for hearing changes and ear pain, negative for fever.  All other systems have been reviewed and were otherwise negative with the exception of those mentioned in the HPI and as above.  Physical Exam: General: Alert, no acute distress Ears: Ear canals are clear bilaterally. Right TM is clear and intact with thinning over anteroinferior TM c/w  previous tube placement. Left TM has fluid behind it. No evidence of mucopurulence. Pneumatic otoscopy reveals normal movement of the right TM but no movement of left TM Nasal: Moderate rhinitis with purulent drainage from middle meatus bilaterally. No evidence of polyps or masses. Oral: Tonsils surgically absent; Clear oropharynx Neck: No palpable adenopathy or masses  Procedures  Assessment: Serous otitis media, left ear Acute sinusitis  Plan: Reviewed with patient findings on examination. Patient states she is unable to tolerate Augmentin due to hives in the past. Recommended Ceftin 500mg  BID x10 days; Rx sent. Recommend she continue use of the Flonase spray as prescribed. She will return PRN.   Christin Hoffstadt, PA-C   I agree with the assessment and plan as outlined above. Radene Journey, MD

## 2019-07-07 ENCOUNTER — Ambulatory Visit: Payer: 59 | Attending: Internal Medicine

## 2019-07-07 DIAGNOSIS — Z20822 Contact with and (suspected) exposure to covid-19: Secondary | ICD-10-CM

## 2019-07-08 LAB — NOVEL CORONAVIRUS, NAA: SARS-CoV-2, NAA: NOT DETECTED

## 2019-07-24 ENCOUNTER — Other Ambulatory Visit: Payer: Self-pay | Admitting: Family Medicine

## 2019-07-24 DIAGNOSIS — Z1231 Encounter for screening mammogram for malignant neoplasm of breast: Secondary | ICD-10-CM

## 2019-08-31 ENCOUNTER — Other Ambulatory Visit: Payer: Self-pay

## 2019-08-31 ENCOUNTER — Ambulatory Visit
Admission: RE | Admit: 2019-08-31 | Discharge: 2019-08-31 | Disposition: A | Discharge status: Home / Self Care | Payer: 59 | Source: Ambulatory Visit | Attending: Family Medicine | Admitting: Family Medicine

## 2019-08-31 DIAGNOSIS — Z1231 Encounter for screening mammogram for malignant neoplasm of breast: Secondary | ICD-10-CM

## 2020-05-07 ENCOUNTER — Other Ambulatory Visit: Payer: Self-pay

## 2020-05-07 ENCOUNTER — Ambulatory Visit: Payer: 59 | Admitting: Cardiology

## 2020-05-07 ENCOUNTER — Encounter: Payer: Self-pay | Admitting: Cardiology

## 2020-05-07 VITALS — BP 158/95 | HR 77 | Resp 16 | Ht 68.0 in | Wt 255.0 lb

## 2020-05-07 DIAGNOSIS — I10 Essential (primary) hypertension: Secondary | ICD-10-CM

## 2020-05-07 DIAGNOSIS — R0609 Other forms of dyspnea: Secondary | ICD-10-CM

## 2020-05-07 DIAGNOSIS — Z87891 Personal history of nicotine dependence: Secondary | ICD-10-CM

## 2020-05-07 DIAGNOSIS — R0789 Other chest pain: Secondary | ICD-10-CM

## 2020-05-07 DIAGNOSIS — R002 Palpitations: Secondary | ICD-10-CM

## 2020-05-07 DIAGNOSIS — Z8616 Personal history of COVID-19: Secondary | ICD-10-CM

## 2020-05-07 MED ORDER — METOPROLOL TARTRATE 25 MG PO TABS: 25.0000 mg | ORAL_TABLET | Freq: Two times a day (BID) | ORAL | 0 refills | Status: DC

## 2020-05-07 NOTE — Progress Notes (Signed)
Date:  05/07/2020   ID:  Stephanie Harrison, DOB 1975-12-05, MRN [id number]  PCP:  Aletha Halim., PA-C  Cardiologist:  Rex Kras, DO, [facility] (established care 05/07/2020)  REASON FOR CONSULT: Chest tightness, Dyspnea on exertion, palpitations.  REQUESTING PHYSICIAN:  Clyde Lundborg PA-C [place] [id number]   Chief Complaint  Patient presents with   Palpitations   DOE   Chest Pain   New Patient (Initial Visit)    Referred by Brynda Peon, PA    HPI  Stephanie Harrison is a 44 y.o. female who presents to the office with a chief complaint of " chest tightness, shortness of breath, palpitations." Patient's past medical history and cardiovascular risk factors include: Hypertension, history of COVID-19 infection, former smoker, obesity due to excess calories.  She is referred to the office at the request of Clyde Lundborg PA-C for evaluation of Chest tightness, Dyspnea on exertion, palpitations.  Dyspnea on exertion: Patient states that she has been experiencing dyspnea on exertion for the last 3 weeks she started noticing the symptoms with effort related activities such as walking her dog and going up flights of stairs.  Such activities she did not bring on symptoms in the past and therefore she has been concerned.  This subsequently led to chest tightness.  Chest pain/tightness: Patient states that she has been having chest tightness for the last 3 weeks, symptoms are intermittent, last for a few minutes, intensity 8 out of 10, not worsened with effort related activities, does not resolve with rest, self-limited, has not tried any medications to help with the symptoms.  Palpitations: In the interim she is also noted symptoms of palpitations that occur randomly, mostly associated with stressful situations, last for few minutes, intermittent, improved with a deep breath, no worsening factors, patient does not drink excessive amount of alcohol,  no active smoking, no use of herbal supplements/stimulants/illicits/coffee.  But she does consume 1 L of caffeinated beverages (i.e. [facility]) per day she was recently instructed by her PCP to decrease her soda consumption and is trying noncaffeinated beverages.  Patient's office blood pressures are not well controlled.  Patient states that her home blood pressure usually is around the mid 140s over 95-100 mmHg she is currently working with her PCP for medication titration.  Denies prior history of coronary artery disease, myocardial infarction, congestive heart failure, deep venous thrombosis, pulmonary embolism, stroke, transient ischemic attack.  FUNCTIONAL STATUS: No structured exercise program or daily routine.   ALLERGIES: No Known Allergies  MEDICATION LIST PRIOR TO VISIT: Current Meds  Medication Sig   amoxicillin-clavulanate (AUGMENTIN) 875-125 MG tablet Take 1 tablet by mouth 2 (two) times daily.   hydrochlorothiazide (HYDRODIURIL) 25 MG tablet TAKE 1 TABLET BY MOUTH ONCE DAILY NEED  OFFICE  VISIT   ibuprofen (ADVIL) 800 MG tablet Take 800 mg by mouth every 8 (eight) hours as needed. For cramps    LORazepam (ATIVAN) 0.5 MG tablet Take 1 tablet by mouth 3 (three) times daily.   Multiple Vitamin (MULTIVITAMIN WITH MINERALS) TABS Take 1 tablet by mouth daily.   traMADol (ULTRAM) 50 MG tablet Take 50 mg by mouth in the morning, at noon, and at bedtime.    [DISCONTINUED] metoprolol succinate (TOPROL-XL) 25 MG 24 hr tablet Take 25 mg by mouth daily.     PAST MEDICAL HISTORY: Past Medical History:  Diagnosis Date   Anxiety    History of 2019 novel coronavirus disease (COVID-19)    Hypertension  No pertinent past medical history     PAST SURGICAL HISTORY: Past Surgical History:  Procedure Laterality Date   CARPAL TUNNEL RELEASE     bil wrists   LAPAROSCOPIC TUBAL LIGATION  04/08/2012   Procedure: LAPAROSCOPIC TUBAL LIGATION;  Surgeon: Lahoma Crocker, MD;   Location: [facility];  Service: Gynecology;  Laterality: Bilateral;   TONSILLECTOMY      FAMILY HISTORY: The patient family history includes Cirrhosis in her father; Heart Problems in her mother; Hypertension in her father and mother.  SOCIAL HISTORY:  The patient  reports that she quit smoking about 7 years ago. Her smoking use included cigarettes. She started smoking about 27 years ago. She has a 5.00 pack-year smoking history. She has never used smokeless tobacco. She reports current alcohol use. She reports that she does not use drugs.  REVIEW OF SYSTEMS: Review of Systems  Constitutional: Negative for chills and fever.  HENT: Negative for hoarse voice and nosebleeds.        Hard of hearing  Eyes: Negative for discharge, double vision and pain.  Cardiovascular: Positive for chest pain, dyspnea on exertion and palpitations. Negative for claudication, leg swelling, near-syncope, orthopnea, paroxysmal nocturnal dyspnea and syncope.  Respiratory: Negative for hemoptysis and shortness of breath.   Musculoskeletal: Negative for muscle cramps and myalgias.  Gastrointestinal: Negative for abdominal pain, constipation, diarrhea, hematemesis, hematochezia, melena, nausea and vomiting.  Neurological: Negative for dizziness and light-headedness.    PHYSICAL EXAM: Vitals with BMI 05/07/2020 05/07/2020 02/03/2018  Height - 5' 8"  -  Weight - 255 lbs 274 lbs  BMI - 50.93 26.71  Systolic 245 809 983  Diastolic 95 98 85  Pulse 77 83 109   CONSTITUTIONAL: Well-developed and well-nourished. No acute distress.  SKIN: Skin is warm and dry. No rash noted. No cyanosis. No pallor. No jaundice HEAD: Normocephalic and atraumatic.  EYES: No scleral icterus MOUTH/THROAT: Moist oral membranes.  NECK: No JVD present. No thyromegaly noted. No carotid bruits  LYMPHATIC: No visible cervical adenopathy.  CHEST Normal respiratory effort. No intercostal retractions  LUNGS: Clear to auscultation bilaterally.  No  stridor. No wheezes. No rales.  CARDIOVASCULAR: Regular rate and rhythm, positive S1-S2, no murmurs rubs or gallops appreciated ABDOMINAL: Obese, soft, nontender, nondistended, positive bowel sounds in all 4 quadrants.  No apparent ascites.  EXTREMITIES: No peripheral edema  HEMATOLOGIC: No significant bruising NEUROLOGIC: Oriented to person, place, and time. Nonfocal. Normal muscle tone.  PSYCHIATRIC: Normal mood and affect. Normal behavior. Cooperative  CARDIAC DATABASE: EKG: 05/07/2020: Normal sinus rhythm, 89 bpm, low voltage in the precordial leads, left atrial enlargement, poor R wave progression, occasional PVCs.  Echocardiogram: No results found for this or any previous visit from the past 1095 days.    Stress Testing: No results found for this or any previous visit from the past 1095 days.   Heart Catheterization: None  LABORATORY DATA: External Labs: Collected: 05/01/2020 at [facility] Hemoglobin 13.9 g/dL. D-dimer 340 BNP 12.4 TSH 1.005 Creatinine 0.75 mg/dL. eGFR: >90 mL/min per 1.73 m  IMPRESSION:    ICD-10-CM   1. Chest tightness  R07.89 EKG 12-Lead    CT CORONARY MORPH W/CTA COR W/SCORE W/CA W/CM &/OR WO/CM    CT CORONARY FRACTIONAL FLOW RESERVE DATA PREP    CT CORONARY FRACTIONAL FLOW RESERVE FLUID ANALYSIS    PCV ECHOCARDIOGRAM COMPLETE  2. DOE (dyspnea on exertion)  R06.00 CT CORONARY MORPH W/CTA COR W/SCORE W/CA W/CM &/OR WO/CM    CT CORONARY FRACTIONAL  FLOW RESERVE DATA PREP    CT CORONARY FRACTIONAL FLOW RESERVE FLUID ANALYSIS    PCV ECHOCARDIOGRAM COMPLETE    metoprolol tartrate (LOPRESSOR) 25 MG tablet  3. Palpitations  R00.2 metoprolol tartrate (LOPRESSOR) 25 MG tablet  4. Former smoker  Z87.891   5. Benign hypertension  I10   6. History of COVID-19  Z86.16      RECOMMENDATIONS: Idy Rawling is a 44 y.o. female whose past medical history and cardiac risk factors include: Hypertension, history of COVID-19 infection,  former smoker, obesity due to excess calories.  Chest tightness and dyspnea on exertion:  Patient's chest tightness appears to be atypical in nature and given her continued dyspnea on exertion she is referred to cardiology for further evaluation.  Recommend coronary CTA +/-FFR to evaluate for obstructive CAD.  Recent blood work from October 2021 reviewed from care everywhere noted above, kidney function within normal limits  Did not check a beta-hCG as the patient has a history of tubal ligation.  D-dimer is within normal limits.  BNP within normal limits.  Transition from Toprol-XL to Lopressor for better heart rate control for the upcoming coronary CTA.  Palpitations:  TSH within normal limits.  Agree with PCPs recommendation on decreasing the consumption of caffeinated beverages to see if the symptoms improve.  Patient had occasional PVCs on the surface ECG.  Hopefully this will improve as result of uptitrating beta-blocker therapy.  If the symptoms persist we will consider an extended Holter monitor at the next visit  Former smoker: Educated on the importance of continued smoking cessation.  Benign essential hypertension:  Office blood pressure is currently not at goal.  Patient is encouraged to keep a log of her blood pressures and to call her PCP as recommended if her systolic blood pressures are consistently greater than 130 mmHg.  Currently managed by primary care provider.  Low-salt diet recommended.  Will monitor it peripherally.  She is encouraged to decrease anxiety/stress as they may be contributing to her underlying symptoms of chest tightness and dyspnea on exertion as well.  Total time spent: 45 minutes.  Independently reviewed outside records from [facility], discussing patient's of history of present illness and disease management, ordering diagnostic tests, performing and interpretation of EKG, coordination of care.  FINAL MEDICATION  LIST END OF ENCOUNTER: Meds ordered this encounter  Medications   metoprolol tartrate (LOPRESSOR) 25 MG tablet    Sig: Take 1 tablet (25 mg total) by mouth 2 (two) times daily.    Dispense:  180 tablet    Refill:  0     Current Outpatient Medications:    amoxicillin-clavulanate (AUGMENTIN) 875-125 MG tablet, Take 1 tablet by mouth 2 (two) times daily., Disp: , Rfl:    hydrochlorothiazide (HYDRODIURIL) 25 MG tablet, TAKE 1 TABLET BY MOUTH ONCE DAILY NEED  OFFICE  VISIT, Disp: , Rfl: 0   ibuprofen (ADVIL) 800 MG tablet, Take 800 mg by mouth every 8 (eight) hours as needed. For cramps , Disp: , Rfl:    LORazepam (ATIVAN) 0.5 MG tablet, Take 1 tablet by mouth 3 (three) times daily., Disp: , Rfl:    Multiple Vitamin (MULTIVITAMIN WITH MINERALS) TABS, Take 1 tablet by mouth daily., Disp: , Rfl:    traMADol (ULTRAM) 50 MG tablet, Take 50 mg by mouth in the morning, at noon, and at bedtime. , Disp: , Rfl:    metoprolol tartrate (LOPRESSOR) 25 MG tablet, Take 1 tablet (25 mg total) by mouth 2 (two)  times daily., Disp: 180 tablet, Rfl: 0  Orders Placed This Encounter  Procedures   CT CORONARY MORPH W/CTA COR W/SCORE W/CA W/CM &/OR WO/CM   CT CORONARY FRACTIONAL FLOW RESERVE DATA PREP   CT CORONARY FRACTIONAL FLOW RESERVE FLUID ANALYSIS   EKG 12-Lead   PCV ECHOCARDIOGRAM COMPLETE    There are no Patient Instructions on file for this visit.   --Continue cardiac medications as reconciled in final medication list. --Return in about 4 weeks (around 06/04/2020) for Review test results, Reevaluation of, Dyspnea. Or sooner if needed. --Continue follow-up with your primary care physician regarding the management of your other chronic comorbid conditions.  Patient's questions and concerns were addressed to her satisfaction. She voices understanding of the instructions provided during this encounter.   This note was created using a voice recognition software as a result there may be  grammatical errors inadvertently enclosed that do not reflect the nature of this encounter. Every attempt is made to correct such errors.  Rex Kras, [place], [facility]  Pager: [phone] Office: [phone]

## 2020-05-09 ENCOUNTER — Other Ambulatory Visit: Payer: Self-pay

## 2020-05-09 ENCOUNTER — Ambulatory Visit: Payer: BC Managed Care – PPO

## 2020-05-09 DIAGNOSIS — R0609 Other forms of dyspnea: Secondary | ICD-10-CM

## 2020-05-09 DIAGNOSIS — R0789 Other chest pain: Secondary | ICD-10-CM

## 2020-05-14 ENCOUNTER — Telehealth (HOSPITAL_COMMUNITY): Payer: Self-pay | Admitting: *Deleted

## 2020-05-14 NOTE — Telephone Encounter (Signed)

## 2020-05-15 ENCOUNTER — Encounter: Payer: Self-pay | Admitting: *Deleted

## 2020-05-15 ENCOUNTER — Ambulatory Visit (HOSPITAL_COMMUNITY)
Admission: RE | Admit: 2020-05-15 | Discharge: 2020-05-15 | Disposition: A | Discharge status: Home / Self Care | Payer: BC Managed Care – PPO | Source: Ambulatory Visit | Attending: Cardiology | Admitting: Cardiology

## 2020-05-15 ENCOUNTER — Other Ambulatory Visit: Payer: Self-pay

## 2020-05-15 DIAGNOSIS — R0789 Other chest pain: Secondary | ICD-10-CM

## 2020-05-15 DIAGNOSIS — R0600 Dyspnea, unspecified: Secondary | ICD-10-CM

## 2020-05-15 DIAGNOSIS — Z006 Encounter for examination for normal comparison and control in clinical research program: Secondary | ICD-10-CM

## 2020-05-15 DIAGNOSIS — R0609 Other forms of dyspnea: Secondary | ICD-10-CM

## 2020-05-15 MED ORDER — IOHEXOL 350 MG/ML SOLN
80.0000 mL | Freq: Once | INTRAVENOUS | Status: AC | PRN
  Administered 2020-05-15: 80 mL via INTRAVENOUS

## 2020-05-15 MED ORDER — NITROGLYCERIN 0.4 MG SL SUBL
SUBLINGUAL_TABLET | SUBLINGUAL | Status: AC
  Filled 2020-05-15: qty 2

## 2020-05-15 MED ORDER — METOPROLOL TARTRATE 5 MG/5ML IV SOLN
INTRAVENOUS | Status: AC
  Filled 2020-05-15: qty 5

## 2020-05-15 MED ORDER — NITROGLYCERIN 0.4 MG SL SUBL
0.8000 mg | SUBLINGUAL_TABLET | Freq: Once | SUBLINGUAL | Status: AC
  Administered 2020-05-15: 0.8 mg via SUBLINGUAL

## 2020-05-15 MED ORDER — METOPROLOL TARTRATE 5 MG/5ML IV SOLN
INTRAVENOUS | Status: AC
  Filled 2020-05-15: qty 10

## 2020-05-15 MED ORDER — METOPROLOL TARTRATE 5 MG/5ML IV SOLN
5.0000 mg | INTRAVENOUS | Status: DC | PRN
  Administered 2020-05-15 (×2): 5 mg via INTRAVENOUS

## 2020-05-15 NOTE — Research (Signed)
CADFEM Informed Consent                  Subject Name:   Stephanie Harrison   Subject met inclusion and exclusion criteria.  The informed consent form, study requirements and expectations were reviewed with the subject and questions and concerns were addressed prior to the signing of the consent form.  The subject verbalized understanding of the trial requirements.  The subject agreed to participate in the CADFEM trial and signed the informed consent.  The informed consent was obtained prior to performance of any protocol-specific procedures for the subject.  A copy of the signed informed consent was given to the subject and a copy was placed in the subject's medical record.   [place] Jasraj Lappe, Research Assistant  05/15/2020  07:12 a.m.

## 2020-06-04 ENCOUNTER — Ambulatory Visit: Payer: BC Managed Care – PPO | Admitting: Cardiology

## 2020-06-04 ENCOUNTER — Other Ambulatory Visit: Payer: Self-pay

## 2020-06-04 ENCOUNTER — Ambulatory Visit: Payer: BC Managed Care – PPO

## 2020-06-04 ENCOUNTER — Encounter: Payer: Self-pay | Admitting: Cardiology

## 2020-06-04 VITALS — BP 143/97 | HR 77 | Resp 16 | Ht 68.0 in | Wt 251.0 lb

## 2020-06-04 DIAGNOSIS — R002 Palpitations: Secondary | ICD-10-CM

## 2020-06-04 DIAGNOSIS — Z712 Person consulting for explanation of examination or test findings: Secondary | ICD-10-CM

## 2020-06-04 DIAGNOSIS — R0609 Other forms of dyspnea: Secondary | ICD-10-CM

## 2020-06-04 DIAGNOSIS — R0600 Dyspnea, unspecified: Secondary | ICD-10-CM

## 2020-06-04 DIAGNOSIS — I2510 Atherosclerotic heart disease of native coronary artery without angina pectoris: Secondary | ICD-10-CM

## 2020-06-04 DIAGNOSIS — I10 Essential (primary) hypertension: Secondary | ICD-10-CM

## 2020-06-04 DIAGNOSIS — Z8616 Personal history of COVID-19: Secondary | ICD-10-CM

## 2020-06-04 DIAGNOSIS — Z87891 Personal history of nicotine dependence: Secondary | ICD-10-CM

## 2020-06-04 DIAGNOSIS — Z6838 Body mass index (BMI) 38.0-38.9, adult: Secondary | ICD-10-CM

## 2020-06-04 MED ORDER — ATORVASTATIN CALCIUM 20 MG PO TABS: 40.0000 mg | ORAL_TABLET | Freq: Every day | ORAL | 0 refills | Status: DC

## 2020-06-04 MED ORDER — METOPROLOL TARTRATE 50 MG PO TABS: 50.0000 mg | ORAL_TABLET | Freq: Two times a day (BID) | ORAL | 0 refills | Status: DC

## 2020-06-04 MED ORDER — ASPIRIN EC 81 MG PO TBEC: 81.0000 mg | DELAYED_RELEASE_TABLET | Freq: Every day | ORAL | 3 refills | Status: DC

## 2020-06-04 NOTE — Progress Notes (Signed)
Date:  06/04/2020   ID:  Stephanie Harrison, DOB Feb 10, 1976, MRN [id number]  PCP:  Aletha Halim., PA-C  Cardiologist:  Rex Kras, DO, [facility] (established care 05/07/2020)  Date: 06/04/20 Last Office Visit: 05/07/2020  Chief Complaint  Patient presents with  . Chest Pain  . Shortness of Breath  . Follow-up    HPI  Stephanie Harrison is a 44 y.o. female who presents to the office with a chief complaint of " reevaluation of chest pain, shortness of breath, palpitations and discuss test results." Patient's past medical history and cardiovascular risk factors include: Coronary artery calcification, nonobstructive CAD, hypertension, history of COVID-19 infection, former smoker, obesity due to excess calories.  Patient is accompanied by her husband at today's office visit.  She is referred to the office at the request of Clyde Lundborg PA-C for evaluation of Chest tightness, Dyspnea on exertion, palpitations.  Given patient's risk factors and symptoms of dyspnea on exertion and chest tightness she was recommended to undergo coronary CTA at last office visit.  She is noted to have mild coronary artery calcification with a coronary calcium score of 39AU.  She is also noted to have mild to moderate coronary artery disease which is not hemodynamically significant based on CT FFR.  Since last office visit patient states that her symptoms of chest pain have resolved but continues to have effort related dyspnea.  Patient states that palpitations also continue mostly associated with her episodes of anxiety, stressful situations, last for few minutes, intermittent.  No near syncope or syncopal events.  At last office visit patient noted that she was drinking 1 L caffeinated [facility] on a daily basis.  Patient continues to drink more than the recommended amount of soda but states its decaffeinated.   FUNCTIONAL STATUS: No structured exercise program or daily routine.    ALLERGIES: Allergies  Allergen Reactions  . Lisinopril Cough    MEDICATION LIST PRIOR TO VISIT: Current Meds  Medication Sig  . Ascorbic Acid (VITAMIN C) 100 MG tablet Take 100 mg by mouth daily.  . cholecalciferol (VITAMIN D3) 25 MCG (1000 UNIT) tablet Take 1,000 Units by mouth daily.  . hydrochlorothiazide (HYDRODIURIL) 25 MG tablet TAKE 1 TABLET BY MOUTH ONCE DAILY NEED  OFFICE  VISIT  . ibuprofen (ADVIL) 800 MG tablet Take 800 mg by mouth every 8 (eight) hours as needed. For cramps   . LORazepam (ATIVAN) 0.5 MG tablet Take 1 tablet by mouth 3 (three) times daily.  . traMADol (ULTRAM) 50 MG tablet Take 50 mg by mouth in the morning, at noon, and at bedtime.   . [DISCONTINUED] metoprolol tartrate (LOPRESSOR) 25 MG tablet Take 1 tablet (25 mg total) by mouth 2 (two) times daily.     PAST MEDICAL HISTORY: Past Medical History:  Diagnosis Date  . Anxiety   . History of 2019 novel coronavirus disease (COVID-19)   . Hypertension   . No pertinent past medical history     PAST SURGICAL HISTORY: Past Surgical History:  Procedure Laterality Date  . CARPAL TUNNEL RELEASE     bil wrists  . LAPAROSCOPIC TUBAL LIGATION  04/08/2012   Procedure: LAPAROSCOPIC TUBAL LIGATION;  Surgeon: Lahoma Crocker, MD;  Location: [facility];  Service: Gynecology;  Laterality: Bilateral;  . TONSILLECTOMY      FAMILY HISTORY: The patient family history includes Cirrhosis in her father; Heart Problems in her mother; Hypertension in her father and mother.  SOCIAL HISTORY:  The patient  reports that she  quit smoking about 7 years ago. Her smoking use included cigarettes. She started smoking about 27 years ago. She has a 5.00 pack-year smoking history. She has never used smokeless tobacco. She reports current alcohol use. She reports that she does not use drugs.  REVIEW OF SYSTEMS: Review of Systems  Constitutional: Negative for chills and fever.  HENT: Negative for hoarse voice and nosebleeds.         Hard of hearing  Eyes: Negative for discharge, double vision and pain.  Cardiovascular: Positive for dyspnea on exertion and palpitations. Negative for chest pain, claudication, leg swelling, near-syncope, orthopnea, paroxysmal nocturnal dyspnea and syncope.  Respiratory: Negative for hemoptysis and shortness of breath.   Musculoskeletal: Negative for muscle cramps and myalgias.  Gastrointestinal: Negative for abdominal pain, constipation, diarrhea, hematemesis, hematochezia, melena, nausea and vomiting.  Neurological: Negative for dizziness and light-headedness.    PHYSICAL EXAM: Vitals with BMI 06/04/2020 06/04/2020 05/15/2020  Height - 5' 8"  -  Weight - 251 lbs -  BMI - 17.61 -  Systolic 607 371 062  Diastolic 97 98 84  Pulse 77 87 -   CONSTITUTIONAL: Well-developed and well-nourished. No acute distress.  SKIN: Skin is warm and dry. No rash noted. No cyanosis. No pallor. No jaundice HEAD: Normocephalic and atraumatic.  EYES: No scleral icterus MOUTH/THROAT: Moist oral membranes.  NECK: No JVD present. No thyromegaly noted. No carotid bruits  LYMPHATIC: No visible cervical adenopathy.  CHEST Normal respiratory effort. No intercostal retractions  LUNGS: Clear to auscultation bilaterally.  No stridor. No wheezes. No rales.  CARDIOVASCULAR: Regular rate and rhythm, positive S1-S2, no murmurs rubs or gallops appreciated ABDOMINAL: Obese, soft, nontender, nondistended, positive bowel sounds in all 4 quadrants.  No apparent ascites.  EXTREMITIES: No peripheral edema  HEMATOLOGIC: No significant bruising NEUROLOGIC: Oriented to person, place, and time. Nonfocal. Normal muscle tone.  PSYCHIATRIC: Normal mood and affect. Normal behavior. Cooperative  CARDIAC DATABASE: EKG: 05/07/2020: Normal sinus rhythm, 89 bpm, low voltage in the precordial leads, left atrial enlargement, poor R wave progression, occasional PVCs.  Echocardiogram: 05/09/2020:  Normal LV systolic function with visual  EF 55-60%. Left ventricle cavity is normal in size. Normal global wall motion. Normal diastolic filling pattern, normal LAP. No obvious regional wall motion abnormalities.  No significant valvular heart disease.  No prior study for comparison.    Stress Testing: No results found for this or any previous visit from the past 1095 days.   Heart Catheterization: None  CCTA 05/15/2020:  1. Coronary calcium score of 39. This was 98th percentile for age and sex matched control.  2. Normal coronary origin with right dominance.  3. CAD-RADS = 3. Moderate stenosis in the proximal to mid LAD due to mixed plaque. Mild stenosis in the mid LCX due to noncalcified plaque. Mild stenosis of the distal RCA due to calcified plaque.  4. Image quality was poor and there was at least moderate signal to noise artifact. The study had to be performed with retrospective gating due to premature ventricular contractions. 5. CT FFR analysis showed no significant stenosis.  RECOMMENDATIONS:Moderate stenosis. Consider symptom-guided anti-ischemic pharmacotherapy as well as risk factor modification per guideline directed care.  LABORATORY DATA: External Labs: Collected: 05/01/2020 at [facility] Hemoglobin 13.9 g/dL. D-dimer 340 BNP 12.4 TSH 1.005 Creatinine 0.75 mg/dL. eGFR: >90 mL/min per 1.73 m  Lipid profile: Collected: 07/24/2019 at [facility] obtained from [facility] cholesterol 193, triglycerides 422, HDL 47, LDL 91, non-HDL 146.  IMPRESSION:    ICD-10-CM   1. Coronary atherosclerosis due to calcified coronary lesion  I25.10 aspirin EC 81 MG tablet   I25.84 atorvastatin (LIPITOR) 20 MG tablet    CMP14+EGFR    Lipid Panel With LDL/HDL Ratio  2. Nonobstructive atherosclerosis of coronary artery  I25.10 aspirin EC 81 MG tablet    atorvastatin (LIPITOR) 20 MG tablet    CMP14+EGFR    Lipid Panel With LDL/HDL Ratio    metoprolol tartrate (LOPRESSOR) 50  MG tablet  3. DOE (dyspnea on exertion)  R06.00 LONG TERM MONITOR (3-14 DAYS)  4. Palpitations  R00.2 LONG TERM MONITOR (3-14 DAYS)    metoprolol tartrate (LOPRESSOR) 50 MG tablet  5. Benign hypertension  I10   6. History of COVID-19  Z86.16   7. Former smoker  Z87.891   35. Class 2 severe obesity due to excess calories with serious comorbidity and body mass index (BMI) of 38.0 to 38.9 in adult (HCC)  E66.01    Z68.38   9. Encounter to discuss test results  Z71.2      RECOMMENDATIONS: Stephanie Harrison is a 44 y.o. female whose past medical history and cardiac risk factors include: Coronary artery calcification, nonobstructive CAD, hypertension, history of COVID-19 infection, former smoker, obesity due to excess calories.  Nonobstructive CAD and coronary artery calcification:  Start aspirin and statin therapy.  Check fasting lipid profile and CMP prior to starting statin therapy.  Educated on importance of lifestyle modifications for secondary prevention: Weight loss, ensuring glycemic control, continued smoking cessation, blood pressure management, and healthy lifestyle changes.  Continue risk factor modification and up titration of guideline directed medical therapy.  No additional cardiovascular testing recommended at this time.  Palpitations:  TSH within normal limits.  Patient is educated on the complete cessation of soda as she currently consumes approximately 1 L.  Increase metoprolol to 50 mg p.o. twice daily, prescription provided  When she had a coronary CTA the study had to be done retrospective due to multiple ectopic beats.  The patient continues to have palpitations and effort related dyspnea recommend a 7-day Holter monitor to evaluate for underlying dysrhythmias.  Former smoker: Educated on the importance of continued smoking cessation.  Benign essential hypertension:  Office blood pressure is currently not at goal.  Patient is encouraged to keep a log of her  blood pressures and to review it with her PCP.  Currently managed by primary care provider.  Low-salt diet recommended.  Will monitor it peripherally.  Total time spent: 40 minutes.  Independently reviewed outside labs from [facility], reviewed the results of the coronary CTA and echocardiogram in detail, discussed disease management, ordering diagnostic tests, and coordination of care.  FINAL MEDICATION LIST END OF ENCOUNTER: Meds ordered this encounter  Medications  . aspirin EC 81 MG tablet    Sig: Take 1 tablet (81 mg total) by mouth daily. Swallow whole.    Dispense:  90 tablet    Refill:  3  . atorvastatin (LIPITOR) 20 MG tablet    Sig: Take 2 tablets (40 mg total) by mouth at bedtime.    Dispense:  180 tablet    Refill:  0  . metoprolol tartrate (LOPRESSOR) 50 MG tablet    Sig: Take 1 tablet (50 mg total) by mouth 2 (two) times daily.    Dispense:  180 tablet    Refill:  0     Current Outpatient Medications:  .  Ascorbic Acid (VITAMIN C) 100 MG  tablet, Take 100 mg by mouth daily., Disp: , Rfl:  .  cholecalciferol (VITAMIN D3) 25 MCG (1000 UNIT) tablet, Take 1,000 Units by mouth daily., Disp: , Rfl:  .  hydrochlorothiazide (HYDRODIURIL) 25 MG tablet, TAKE 1 TABLET BY MOUTH ONCE DAILY NEED  OFFICE  VISIT, Disp: , Rfl: 0 .  ibuprofen (ADVIL) 800 MG tablet, Take 800 mg by mouth every 8 (eight) hours as needed. For cramps , Disp: , Rfl:  .  LORazepam (ATIVAN) 0.5 MG tablet, Take 1 tablet by mouth 3 (three) times daily., Disp: , Rfl:  .  traMADol (ULTRAM) 50 MG tablet, Take 50 mg by mouth in the morning, at noon, and at bedtime. , Disp: , Rfl:  .  aspirin EC 81 MG tablet, Take 1 tablet (81 mg total) by mouth daily. Swallow whole., Disp: 90 tablet, Rfl: 3 .  atorvastatin (LIPITOR) 20 MG tablet, Take 2 tablets (40 mg total) by mouth at bedtime., Disp: 180 tablet, Rfl: 0 .  metoprolol tartrate (LOPRESSOR) 50 MG tablet, Take 1 tablet (50 mg total) by mouth 2 (two)  times daily., Disp: 180 tablet, Rfl: 0  Orders Placed This Encounter  Procedures  . CMP14+EGFR  . Lipid Panel With LDL/HDL Ratio  . LONG TERM MONITOR (3-14 DAYS)    There are no Patient Instructions on file for this visit.   --Continue cardiac medications as reconciled in final medication list. --Return in about 5 weeks (around 07/09/2020) for Reevaluation of palpitation, Review test results. Or sooner if needed. --Continue follow-up with your primary care physician regarding the management of your other chronic comorbid conditions.  Patient's questions and concerns were addressed to her satisfaction. She voices understanding of the instructions provided during this encounter.   This note was created using a voice recognition software as a result there may be grammatical errors inadvertently enclosed that do not reflect the nature of this encounter. Every attempt is made to correct such errors.  Rex Kras, [place], [facility]  Pager: [phone] Office: [phone]

## 2020-06-08 LAB — CMP14+EGFR
ALT: 15 IU/L (ref 0–32)
AST: 12 IU/L (ref 0–40)
Albumin/Globulin Ratio: 1.9 (ref 1.2–2.2)
Albumin: 4.5 g/dL (ref 3.8–4.8)
Alkaline Phosphatase: 58 IU/L (ref 44–121)
BUN/Creatinine Ratio: 16 (ref 9–23)
BUN: 12 mg/dL (ref 6–24)
Bilirubin Total: 1.7 mg/dL — ABNORMAL HIGH (ref 0.0–1.2)
CO2: 24 mmol/L (ref 20–29)
Calcium: 9.8 mg/dL (ref 8.7–10.2)
Chloride: 100 mmol/L (ref 96–106)
Creatinine, Ser: 0.74 mg/dL (ref 0.57–1.00)
GFR calc Af Amer: 113 mL/min/{1.73_m2} (ref >59)
GFR calc non Af Amer: 98 mL/min/{1.73_m2} (ref >59)
Globulin, Total: 2.4 g/dL (ref 1.5–4.5)
Glucose: 106 mg/dL — ABNORMAL HIGH (ref 65–99)
Potassium: 4.4 mmol/L (ref 3.5–5.2)
Sodium: 139 mmol/L (ref 134–144)
Total Protein: 6.9 g/dL (ref 6.0–8.5)

## 2020-06-08 LAB — LIPID PANEL WITH LDL/HDL RATIO
Cholesterol, Total: 191 mg/dL (ref 100–199)
HDL: 43 mg/dL (ref >39)
LDL Chol Calc (NIH): 120 mg/dL — ABNORMAL HIGH (ref 0–99)
LDL/HDL Ratio: 2.8 ratio (ref 0.0–3.2)
Triglycerides: 159 mg/dL — ABNORMAL HIGH (ref 0–149)
VLDL Cholesterol Cal: 28 mg/dL (ref 5–40)

## 2020-07-09 ENCOUNTER — Ambulatory Visit: Payer: BC Managed Care – PPO | Admitting: Cardiology

## 2020-07-17 ENCOUNTER — Other Ambulatory Visit: Payer: Self-pay | Admitting: Family Medicine

## 2020-07-17 DIAGNOSIS — Z1231 Encounter for screening mammogram for malignant neoplasm of breast: Secondary | ICD-10-CM

## 2020-07-18 ENCOUNTER — Other Ambulatory Visit: Payer: Self-pay

## 2020-07-18 ENCOUNTER — Encounter: Payer: Self-pay | Admitting: Cardiology

## 2020-07-18 ENCOUNTER — Ambulatory Visit: Payer: BC Managed Care – PPO | Admitting: Cardiology

## 2020-07-18 VITALS — BP 125/73 | HR 84 | Ht 68.0 in | Wt 253.0 lb

## 2020-07-18 DIAGNOSIS — R0609 Other forms of dyspnea: Secondary | ICD-10-CM

## 2020-07-18 DIAGNOSIS — I10 Essential (primary) hypertension: Secondary | ICD-10-CM

## 2020-07-18 DIAGNOSIS — Z87891 Personal history of nicotine dependence: Secondary | ICD-10-CM

## 2020-07-18 DIAGNOSIS — Z8616 Personal history of COVID-19: Secondary | ICD-10-CM

## 2020-07-18 DIAGNOSIS — I2510 Atherosclerotic heart disease of native coronary artery without angina pectoris: Secondary | ICD-10-CM

## 2020-07-18 DIAGNOSIS — I2584 Coronary atherosclerosis due to calcified coronary lesion: Secondary | ICD-10-CM

## 2020-07-18 DIAGNOSIS — R002 Palpitations: Secondary | ICD-10-CM

## 2020-07-18 NOTE — Progress Notes (Signed)
ID:  Stephanie Harrison, DOB [birth year], MRN [id number]  PCP:  Aletha Halim., PA-C  Cardiologist:  Rex Kras, DO, [facility] (established care 05/07/2020)  Date: 07/18/20 Last Office Visit: 05/07/2020  Chief Complaint  Patient presents with  . Palpitations    HPI  Stephanie Harrison is a 44 y.o. female who presents to the office with a chief complaint of " reevaluation of palpitations and review test results." Patient's past medical history and cardiovascular risk factors include: Coronary artery calcification, nonobstructive CAD, hypertension, history of COVID-19 infection, former smoker, obesity due to excess calories.  Patient is accompanied by her husband at today's office visit.  She is referred to the office at the request of Clyde Lundborg PA-C for evaluation of Chest tightness, Dyspnea on exertion, palpitations.  Since establishing care patient has undergone ischemic evaluation which included an echocardiogram and coronary CTA. She is noted to have mild coronary artery calcification with a coronary calcium score of 39AU.  She is also noted to have mild to moderate coronary artery disease which is not hemodynamically significant based on CT FFR.  Since last office visit patient states that her symptoms of chest pain have resolved but continues to have effort related dyspnea.  At last visit she was started on aspirin and statin therapy which she has started and tolerating well.  Her symptoms of chest pain and dyspnea on exertion have completely resolved and she feels much better since establishing care.  She was very thankful at today's office visit.  In regards to palpitations the dose of metoprolol was increased to 50 mg p.o. twice daily.  She had a 14-day extended Holter monitor while being on beta-blocker therapy.  The results were reviewed her and her husband at today's office visit including the rhythm strips.  Patient was noted to have predominantly normal sinus rhythm, one  episode of asymptomatic nonsustained ventricular tachycardia, and her 40 patient triggered events for essentially normal sinus or sinus tachycardia with either SVE or VE.   She states that her blood pressure has also improved significantly and this is reflected at today's office visit  FUNCTIONAL STATUS: No structured exercise program or daily routine.   ALLERGIES: Allergies  Allergen Reactions  . Lisinopril Cough    MEDICATION LIST PRIOR TO VISIT: Current Meds  Medication Sig  . Ascorbic Acid (VITAMIN C) 100 MG tablet Take 100 mg by mouth daily.  Marland Kitchen aspirin EC 81 MG tablet Take 1 tablet (81 mg total) by mouth daily. Swallow whole.  Marland Kitchen atorvastatin (LIPITOR) 20 MG tablet Take 2 tablets (40 mg total) by mouth at bedtime.  . cholecalciferol (VITAMIN D3) 25 MCG (1000 UNIT) tablet Take 1,000 Units by mouth daily.  . hydrochlorothiazide (HYDRODIURIL) 25 MG tablet TAKE 1 TABLET BY MOUTH ONCE DAILY NEED  OFFICE  VISIT  . ibuprofen (ADVIL) 800 MG tablet Take 800 mg by mouth every 8 (eight) hours as needed. For cramps  . LORazepam (ATIVAN) 0.5 MG tablet Take 1 tablet by mouth 3 (three) times daily.  . metoprolol tartrate (LOPRESSOR) 50 MG tablet Take 1 tablet (50 mg total) by mouth 2 (two) times daily.  . traMADol (ULTRAM) 50 MG tablet Take 50 mg by mouth in the morning, at noon, and at bedtime.      PAST MEDICAL HISTORY: Past Medical History:  Diagnosis Date  . Anxiety   . Coronary atherosclerosis due to calcified coronary lesion   . History of 2019 novel coronavirus disease (COVID-19)   . Hypertension   .  No pertinent past medical history   . Nonobstructive atherosclerosis of coronary artery     PAST SURGICAL HISTORY: Past Surgical History:  Procedure Laterality Date  . CARPAL TUNNEL RELEASE     bil wrists  . LAPAROSCOPIC TUBAL LIGATION  04/08/2012   Procedure: LAPAROSCOPIC TUBAL LIGATION;  Surgeon: Lahoma Crocker, MD;  Location: [facility];  Service: Gynecology;  Laterality:  Bilateral;  . TONSILLECTOMY      FAMILY HISTORY: The patient family history includes Cirrhosis in her father; Heart Problems in her mother; Hypertension in her father and mother.  SOCIAL HISTORY:  The patient  reports that she quit smoking about 8 years ago. Her smoking use included cigarettes. She started smoking about 28 years ago. She has a 5.00 pack-year smoking history. She has never used smokeless tobacco. She reports current alcohol use. She reports that she does not use drugs.  REVIEW OF SYSTEMS: Review of Systems  Constitutional: Negative for chills and fever.  HENT: Negative for hoarse voice and nosebleeds.        Hard of hearing  Eyes: Negative for discharge, double vision and pain.  Cardiovascular: Negative for chest pain, claudication, dyspnea on exertion, leg swelling, near-syncope, orthopnea, palpitations, paroxysmal nocturnal dyspnea and syncope.  Respiratory: Negative for hemoptysis and shortness of breath.   Musculoskeletal: Negative for muscle cramps and myalgias.  Gastrointestinal: Negative for abdominal pain, constipation, diarrhea, hematemesis, hematochezia, melena, nausea and vomiting.  Neurological: Negative for dizziness and light-headedness.    PHYSICAL EXAM: Vitals with BMI 07/18/2020 06/04/2020 06/04/2020  Height 5' 8"  - 5' 8"   Weight 253 lbs - 251 lbs  BMI 69.67 - 89.38  Systolic 101 751 025  Diastolic 73 97 98  Pulse 84 77 87   CONSTITUTIONAL: Well-developed and well-nourished. No acute distress.  SKIN: Skin is warm and dry. No rash noted. No cyanosis. No pallor. No jaundice HEAD: Normocephalic and atraumatic.  EYES: No scleral icterus MOUTH/THROAT: Moist oral membranes.  NECK: No JVD present. No thyromegaly noted. No carotid bruits  LYMPHATIC: No visible cervical adenopathy.  CHEST Normal respiratory effort. No intercostal retractions  LUNGS: Clear to auscultation bilaterally.  No stridor. No wheezes. No rales.  CARDIOVASCULAR: Regular rate and  rhythm, positive S1-S2, no murmurs rubs or gallops appreciated ABDOMINAL: Obese, soft, nontender, nondistended, positive bowel sounds in all 4 quadrants.  No apparent ascites.  EXTREMITIES: No peripheral edema  HEMATOLOGIC: No significant bruising NEUROLOGIC: Oriented to person, place, and time. Nonfocal. Normal muscle tone.  PSYCHIATRIC: Normal mood and affect. Normal behavior. Cooperative  CARDIAC DATABASE: EKG: 05/07/2020: Normal sinus rhythm, 89 bpm, low voltage in the precordial leads, left atrial enlargement, poor R wave progression, occasional PVCs.  Echocardiogram: 05/09/2020:  Normal LV systolic function with visual EF 55-60%. Left ventricle cavity is normal in size. Normal global wall motion. Normal diastolic filling pattern, normal LAP. No obvious regional wall motion abnormalities.  No significant valvular heart disease.  No prior study for comparison.    Stress Testing: No results found for this or any previous visit from the past 1095 days.  Heart Catheterization: None  CCTA 05/15/2020:  1. Coronary calcium score of 39. This was 98th percentile for age and sex matched control.  2. Normal coronary origin with right dominance.  3. CAD-RADS = 3. Moderate stenosis in the proximal to mid LAD due to mixed plaque. Mild stenosis in the mid LCX due to noncalcified plaque. Mild stenosis of the distal RCA due to calcified plaque.  4. Image quality was poor and  there was at least moderate signal to noise artifact. The study had to be performed with retrospective gating due to premature ventricular contractions. 5. CT FFR analysis showed no significant stenosis.  RECOMMENDATIONS:Moderate stenosis. Consider symptom-guided anti-ischemic pharmacotherapy as well as risk factor modification per guideline directed care.  14 day extended Holter monitor: Dominant rhythm normal sinus rhythm. Heart rate 45 - 179 bpm, Avg HR 82 bpm. No atrial fibrillation, supraventricular tachycardia, high  grade AV block, pauses (3 seconds or longer). Total ventricular ectopic burden <1%. 1 episode of NSVT 5 beats in duration, average rate of 160 bpm. Total supraventricular ectopic burden <1%. Patient triggered events: 40.  Underlying rhythm normal sinus followed by sinus tachycardia with rare SVE and ventricular ectopy.  LABORATORY DATA: External Labs: Collected: 05/01/2020 at [facility] Hemoglobin 13.9 g/dL. D-dimer 340 BNP 12.4 TSH 1.005 Creatinine 0.75 mg/dL. eGFR: >90 mL/min per 1.73 m  Lipid profile: Collected: 07/24/2019 at [facility] obtained from [facility] Total cholesterol 193, triglycerides 422, HDL 47, LDL 91, non-HDL 146.  IMPRESSION:    ICD-10-CM   1. DOE (dyspnea on exertion)  R06.00   2. Palpitations  R00.2   3. Coronary atherosclerosis due to calcified coronary lesion  I25.10 Lipid Panel With LDL/HDL Ratio   I25.84 LDL cholesterol, direct    CMP14+EGFR  4. Nonobstructive atherosclerosis of coronary artery  I25.10   5. Benign hypertension  I10   6. History of COVID-19  Z86.16   7. Former smoker  Z87.891   77. Class 2 severe obesity due to excess calories with serious comorbidity and body mass index (BMI) of 38.0 to 38.9 in adult [facility])  E66.01    Z68.38      RECOMMENDATIONS: Stephanie Harrison is a 44 y.o. female whose past medical history and cardiac risk factors include: Coronary artery calcification, nonobstructive CAD, hypertension, history of COVID-19 infection, former smoker, obesity due to excess calories.  Nonobstructive CAD and coronary artery calcification:  Continue aspirin and statin therapy.  Check fasting lipid profile and CMP prior to the next office visit.  Currently scheduled for October 03, 2020.  Educated on importance of lifestyle modifications for secondary prevention: Weight loss, ensuring glycemic control, continued smoking cessation, blood pressure management, and healthy lifestyle  changes.  Continue risk factor modification and up titration of guideline directed medical therapy.  No additional cardiovascular testing recommended at this time.  Palpitations: Significantly improved  TSH within normal limits.  She has reduced her soda consumption.  In the past that she was consuming approximately 1 L/day.  Reviewed the 14-day extended Holter monitor results at today's office visit.  We discussed uptitrating beta-blocker therapy given her symptomatic episodes of SVT/VE.  However, patient states that she is overall stable and would like to monitor her symptoms at the current doses.  Continue metoprolol to 50 mg p.o. twice daily.  Former smoker: Educated on the importance of continued smoking cessation.  Benign essential hypertension:  Office blood pressure is currently at goal.  Patient is encouraged to keep a log of her blood pressures and to review it with her PCP.  Currently managed by primary care provider.  Low-salt diet recommended.  Will monitor it peripherally.  Total time spent: 39mnutes.  Independently reviewed the monitor results with the patient and her husband at today's office visit including rhythm strips in detail, discussed disease management, ordering diagnostic tests, and coordination of care.  FINAL MEDICATION LIST END OF ENCOUNTER: No orders of the defined  types were placed in this encounter.    Current Outpatient Medications:  .  Ascorbic Acid (VITAMIN C) 100 MG tablet, Take 100 mg by mouth daily., Disp: , Rfl:  .  aspirin EC 81 MG tablet, Take 1 tablet (81 mg total) by mouth daily. Swallow whole., Disp: 90 tablet, Rfl: 3 .  atorvastatin (LIPITOR) 20 MG tablet, Take 2 tablets (40 mg total) by mouth at bedtime., Disp: 180 tablet, Rfl: 0 .  cholecalciferol (VITAMIN D3) 25 MCG (1000 UNIT) tablet, Take 1,000 Units by mouth daily., Disp: , Rfl:  .  hydrochlorothiazide (HYDRODIURIL) 25 MG tablet, TAKE 1 TABLET BY MOUTH ONCE DAILY NEED  OFFICE   VISIT, Disp: , Rfl: 0 .  ibuprofen (ADVIL) 800 MG tablet, Take 800 mg by mouth every 8 (eight) hours as needed. For cramps, Disp: , Rfl:  .  LORazepam (ATIVAN) 0.5 MG tablet, Take 1 tablet by mouth 3 (three) times daily., Disp: , Rfl:  .  metoprolol tartrate (LOPRESSOR) 50 MG tablet, Take 1 tablet (50 mg total) by mouth 2 (two) times daily., Disp: 180 tablet, Rfl: 0 .  traMADol (ULTRAM) 50 MG tablet, Take 50 mg by mouth in the morning, at noon, and at bedtime. , Disp: , Rfl:   Orders Placed This Encounter  Procedures  . Lipid Panel With LDL/HDL Ratio  . LDL cholesterol, direct  . CMP14+EGFR   --Continue cardiac medications as reconciled in final medication list. --Return in about 3 months (around 10/16/2020) for Follow up, Dyspnea, palpitations. Or sooner if needed. --Continue follow-up with your primary care physician regarding the management of your other chronic comorbid conditions.  Patient's questions and concerns were addressed to her satisfaction. She voices understanding of the instructions provided during this encounter.   This note was created using a voice recognition software as a result there may be grammatical errors inadvertently enclosed that do not reflect the nature of this encounter. Every attempt is made to correct such errors.  Rex Kras, [place], [facility]  Pager: [phone] Office: [phone]

## 2020-09-03 ENCOUNTER — Ambulatory Visit: Payer: BC Managed Care – PPO

## 2020-09-09 ENCOUNTER — Other Ambulatory Visit: Payer: Self-pay | Admitting: Family Medicine

## 2020-09-09 DIAGNOSIS — Z1231 Encounter for screening mammogram for malignant neoplasm of breast: Secondary | ICD-10-CM

## 2020-09-11 ENCOUNTER — Other Ambulatory Visit: Payer: Self-pay | Admitting: Cardiology

## 2020-09-11 DIAGNOSIS — I2510 Atherosclerotic heart disease of native coronary artery without angina pectoris: Secondary | ICD-10-CM

## 2020-09-12 ENCOUNTER — Ambulatory Visit: Payer: BC Managed Care – PPO

## 2020-09-12 ENCOUNTER — Other Ambulatory Visit: Payer: Self-pay

## 2020-09-12 ENCOUNTER — Ambulatory Visit
Admission: RE | Admit: 2020-09-12 | Discharge: 2020-09-12 | Disposition: A | Discharge status: Home / Self Care | Payer: BC Managed Care – PPO | Source: Ambulatory Visit | Attending: Family Medicine | Admitting: Family Medicine

## 2020-09-12 DIAGNOSIS — Z1231 Encounter for screening mammogram for malignant neoplasm of breast: Secondary | ICD-10-CM

## 2020-10-04 ENCOUNTER — Other Ambulatory Visit: Payer: Self-pay | Admitting: Cardiology

## 2020-10-04 DIAGNOSIS — I2510 Atherosclerotic heart disease of native coronary artery without angina pectoris: Secondary | ICD-10-CM

## 2020-10-04 DIAGNOSIS — R002 Palpitations: Secondary | ICD-10-CM

## 2020-10-11 LAB — CMP14+EGFR
ALT: 17 IU/L (ref 0–32)
AST: 15 IU/L (ref 0–40)
Albumin/Globulin Ratio: 2.2 (ref 1.2–2.2)
Albumin: 4.8 g/dL (ref 3.8–4.8)
Alkaline Phosphatase: 58 IU/L (ref 44–121)
BUN/Creatinine Ratio: 22 (ref 9–23)
BUN: 16 mg/dL (ref 6–24)
Bilirubin Total: 1.9 mg/dL — ABNORMAL HIGH (ref 0.0–1.2)
CO2: 20 mmol/L (ref 20–29)
Calcium: 9.5 mg/dL (ref 8.7–10.2)
Chloride: 102 mmol/L (ref 96–106)
Creatinine, Ser: 0.74 mg/dL (ref 0.57–1.00)
Globulin, Total: 2.2 g/dL (ref 1.5–4.5)
Glucose: 97 mg/dL (ref 65–99)
Potassium: 3.8 mmol/L (ref 3.5–5.2)
Sodium: 144 mmol/L (ref 134–144)
Total Protein: 7 g/dL (ref 6.0–8.5)
eGFR: 102 mL/min/{1.73_m2} (ref >59)

## 2020-10-11 LAB — LIPID PANEL WITH LDL/HDL RATIO
Cholesterol, Total: 138 mg/dL (ref 100–199)
HDL: 47 mg/dL (ref >39)
LDL Chol Calc (NIH): 75 mg/dL (ref 0–99)
LDL/HDL Ratio: 1.6 ratio (ref 0.0–3.2)
Triglycerides: 84 mg/dL (ref 0–149)
VLDL Cholesterol Cal: 16 mg/dL (ref 5–40)

## 2020-10-11 LAB — LDL CHOLESTEROL, DIRECT: LDL Direct: 73 mg/dL (ref 0–99)

## 2020-10-15 ENCOUNTER — Ambulatory Visit: Payer: BC Managed Care – PPO | Admitting: Cardiology

## 2020-10-16 NOTE — Progress Notes (Deleted)
ID:  Doreene Forrey, DOB 29-Mar-1976, MRN [id number]  PCP:  Aletha Halim., PA-C  Cardiologist: Rex Kras, DO, [facility] (established care 05/07/2020)  ***  No chief complaint on file.   HPI  Stephanie Harrison is a 45 y.o. female who presents to the office with a chief complaint of " ***." Patient's past medical history and cardiovascular risk factors include: Coronary artery calcification, nonobstructive CAD, hypertension, history of COVID-19 infection, former smoker, obesity due to excess calories.  Patient is accompanied by her husband at today's office visit.  She is referred to the office at the request of Clyde Lundborg PA-C for evaluation of Chest tightness, Dyspnea on exertion, palpitations.  Since establishing care patient has undergone ischemic evaluation which included an echocardiogram and coronary CTA. She is noted to have mild coronary artery calcification with a coronary calcium score of 39AU.  She is also noted to have mild to moderate coronary artery disease which is not hemodynamically significant based on CT FFR.  Since last office visit patient states that her symptoms of chest pain have resolved but continues to have effort related dyspnea.  At last visit she was started on aspirin and statin therapy which she has started and tolerating well.  Her symptoms of chest pain and dyspnea on exertion have completely resolved and she feels much better since establishing care.  She was very thankful at today's office visit.  In regards to palpitations the dose of metoprolol was increased to 50 mg p.o. twice daily.  She had a 14-day extended Holter monitor while being on beta-blocker therapy.  The results were reviewed her and her husband at today's office visit including the rhythm strips.  Patient was noted to have predominantly normal sinus rhythm, one episode of asymptomatic nonsustained ventricular tachycardia, and her 40 patient triggered events for essentially normal  sinus or sinus tachycardia with either SVE or VE.   She states that her blood pressure has also improved significantly and this is reflected at today's office visit  FUNCTIONAL STATUS: No structured exercise program or daily routine.   ALLERGIES: Allergies  Allergen Reactions  . Lisinopril Cough    MEDICATION LIST PRIOR TO VISIT: No outpatient medications have been marked as taking for the 10/17/20 encounter (Appointment) with Terri Skains, [place], DO.     PAST MEDICAL HISTORY: Past Medical History:  Diagnosis Date  . Anxiety   . Coronary atherosclerosis due to calcified coronary lesion   . History of 2019 novel coronavirus disease (COVID-19)   . Hypertension   . No pertinent past medical history   . Nonobstructive atherosclerosis of coronary artery     PAST SURGICAL HISTORY: Past Surgical History:  Procedure Laterality Date  . CARPAL TUNNEL RELEASE     bil wrists  . LAPAROSCOPIC TUBAL LIGATION  04/08/2012   Procedure: LAPAROSCOPIC TUBAL LIGATION;  Surgeon: Lahoma Crocker, MD;  Location: [facility];  Service: Gynecology;  Laterality: Bilateral;  . TONSILLECTOMY      FAMILY HISTORY: The patient family history includes Cirrhosis in her father; Heart Problems in her mother; Hypertension in her father and mother.  SOCIAL HISTORY:  The patient  reports that she quit smoking about 8 years ago. Her smoking use included cigarettes. She started smoking about 28 years ago. She has a 5.00 pack-year smoking history. She has never used smokeless tobacco. She reports current alcohol use. She reports that she does not use drugs.  REVIEW OF SYSTEMS: Review of Systems  Constitutional: Negative for chills and fever.  HENT: Negative for hoarse voice and nosebleeds.        Hard of hearing  Eyes: Negative for discharge, double vision and pain.  Cardiovascular: Negative for chest pain, claudication, dyspnea on exertion, leg swelling, near-syncope, orthopnea, palpitations, paroxysmal nocturnal  dyspnea and syncope.  Respiratory: Negative for hemoptysis and shortness of breath.   Musculoskeletal: Negative for muscle cramps and myalgias.  Gastrointestinal: Negative for abdominal pain, constipation, diarrhea, hematemesis, hematochezia, melena, nausea and vomiting.  Neurological: Negative for dizziness and light-headedness.    PHYSICAL EXAM: Vitals with BMI 07/18/2020 06/04/2020 06/04/2020  Height 5' 8"  - 5' 8"   Weight 253 lbs - 251 lbs  BMI 16.10 - 96.04  Systolic 540 981 191  Diastolic 73 97 98  Pulse 84 77 87   CONSTITUTIONAL: Well-developed and well-nourished. No acute distress.  SKIN: Skin is warm and dry. No rash noted. No cyanosis. No pallor. No jaundice HEAD: Normocephalic and atraumatic.  EYES: No scleral icterus MOUTH/THROAT: Moist oral membranes.  NECK: No JVD present. No thyromegaly noted. No carotid bruits  LYMPHATIC: No visible cervical adenopathy.  CHEST Normal respiratory effort. No intercostal retractions  LUNGS: Clear to auscultation bilaterally.  No stridor. No wheezes. No rales.  CARDIOVASCULAR: Regular rate and rhythm, positive S1-S2, no murmurs rubs or gallops appreciated ABDOMINAL: Obese, soft, nontender, nondistended, positive bowel sounds in all 4 quadrants.  No apparent ascites.  EXTREMITIES: No peripheral edema  HEMATOLOGIC: No significant bruising NEUROLOGIC: Oriented to person, place, and time. Nonfocal. Normal muscle tone.  PSYCHIATRIC: Normal mood and affect. Normal behavior. Cooperative  CARDIAC DATABASE: EKG: 05/14/2020: Normal sinus rhythm, 89 bpm, low voltage in the precordial leads, left atrial enlargement, poor R wave progression, occasional PVCs.  Echocardiogram: 05/09/2020:  Normal LV systolic function with visual EF 55-60%. Left ventricle cavity is normal in size. Normal global wall motion. Normal diastolic filling pattern, normal LAP. No obvious regional wall motion abnormalities. No significant valvular heart disease. No prior  study for comparison.    Stress Testing: No results found for this or any previous visit from the past 1095 days.  Heart Catheterization: None  CCTA 05/15/2020:  1. Coronary calcium score of 39. This was 98th percentile for age and sex matched control.  2. Normal coronary origin with right dominance.  3. CAD-RADS = 3. Moderate stenosis in the proximal to mid LAD due to mixed plaque. Mild stenosis in the mid LCX due to noncalcified plaque. Mild stenosis of the distal RCA due to calcified plaque.  4. Image quality was poor and there was at least moderate signal to noise artifact. The study had to be performed with retrospective gating due to premature ventricular contractions. 5. CT FFR analysis showed no significant stenosis.  RECOMMENDATIONS:Moderate stenosis. Consider symptom-guided anti-ischemic pharmacotherapy as well as risk factor modification per guideline directed care.  14 day extended Holter monitor: Dominant rhythm normal sinus rhythm. Heart rate 45 - 179 bpm, Avg HR 82 bpm. No atrial fibrillation, supraventricular tachycardia, high grade AV block, pauses (3 seconds or longer). Total ventricular ectopic burden <1%. 1 episode of NSVT 5 beats in duration, average rate of 160 bpm. Total supraventricular ectopic burden <1%. Patient triggered events: 40.  Underlying rhythm normal sinus followed by sinus tachycardia with rare SVE and ventricular ectopy.  LABORATORY DATA: External Labs: Collected: 05/01/2020 at [facility] Hemoglobin 13.9 g/dL. D-dimer 340 BNP 12.4 TSH 1.005 Creatinine 0.75 mg/dL. eGFR: >90 mL/min per 1.73 m  Lipid profile: Collected: 07/24/2019 at [facility] obtained  from [facility] Total cholesterol 193, triglycerides 422, HDL 47, LDL 91, non-HDL 146.  IMPRESSION:  No diagnosis found.   RECOMMENDATIONS: Lumen Brinlee is a 45 y.o. female whose past medical history and cardiac risk factors include:  Coronary artery calcification, nonobstructive CAD, hypertension, history of COVID-19 infection, former smoker, obesity due to excess calories.  Nonobstructive CAD and coronary artery calcification:  Continue aspirin and statin therapy.  Check fasting lipid profile and CMP prior to the next office visit.  Currently scheduled for October 03, 2020.  Educated on importance of lifestyle modifications for secondary prevention: Weight loss, ensuring glycemic control, continued smoking cessation, blood pressure management, and healthy lifestyle changes.  Continue risk factor modification and up titration of guideline directed medical therapy.  No additional cardiovascular testing recommended at this time.  Palpitations: Significantly improved  TSH within normal limits.  She has reduced her soda consumption.  In the past that she was consuming approximately 1 L/day.  Reviewed the 14-day extended Holter monitor results at today's office visit.  We discussed uptitrating beta-blocker therapy given her symptomatic episodes of SVT/VE.  However, patient states that she is overall stable and would like to monitor her symptoms at the current doses.  Continue metoprolol to 50 mg p.o. twice daily.  Former smoker: Educated on the importance of continued smoking cessation.  Benign essential hypertension:  Office blood pressure is currently at goal.  Patient is encouraged to keep a log of her blood pressures and to review it with her PCP.  Currently managed by primary care provider.  Low-salt diet recommended.  Will monitor it peripherally.  Total time spent: 76mnutes.  Independently reviewed the monitor results with the patient and her husband at today's office visit including rhythm strips in detail, discussed disease management, ordering diagnostic tests, and coordination of care.  FINAL MEDICATION LIST END OF ENCOUNTER: No orders of the defined types were placed in this encounter.    Current  Outpatient Medications:  .  Ascorbic Acid (VITAMIN C) 100 MG tablet, Take 100 mg by mouth daily., Disp: , Rfl:  .  aspirin EC 81 MG tablet, Take 1 tablet (81 mg total) by mouth daily. Swallow whole., Disp: 90 tablet, Rfl: 3 .  atorvastatin (LIPITOR) 20 MG tablet, TAKE 2 TABLETS BY MOUTH AT BEDTIME, Disp: 180 tablet, Rfl: 0 .  cholecalciferol (VITAMIN D3) 25 MCG (1000 UNIT) tablet, Take 1,000 Units by mouth daily., Disp: , Rfl:  .  hydrochlorothiazide (HYDRODIURIL) 25 MG tablet, TAKE 1 TABLET BY MOUTH ONCE DAILY NEED  OFFICE  VISIT, Disp: , Rfl: 0 .  ibuprofen (ADVIL) 800 MG tablet, Take 800 mg by mouth every 8 (eight) hours as needed. For cramps, Disp: , Rfl:  .  LORazepam (ATIVAN) 0.5 MG tablet, Take 1 tablet by mouth 3 (three) times daily., Disp: , Rfl:  .  metoprolol tartrate (LOPRESSOR) 50 MG tablet, Take 1 tablet by mouth twice daily, Disp: 180 tablet, Rfl: 0 .  traMADol (ULTRAM) 50 MG tablet, Take 50 mg by mouth in the morning, at noon, and at bedtime. , Disp: , Rfl:   No orders of the defined types were placed in this encounter.  --Continue cardiac medications as reconciled in final medication list. --No follow-ups on file. Or sooner if needed. --Continue follow-up with your primary care physician regarding the management of your other chronic comorbid conditions.  Patient's questions and concerns were addressed to her satisfaction. She voices understanding of the instructions provided during this encounter.   This  note was created using a voice recognition software as a result there may be grammatical errors inadvertently enclosed that do not reflect the nature of this encounter. Every attempt is made to correct such errors.  Rex Kras, [place], [facility]  Pager: [phone] Office: [phone]

## 2020-10-17 ENCOUNTER — Ambulatory Visit: Payer: BC Managed Care – PPO | Admitting: Cardiology

## 2020-10-17 DIAGNOSIS — R002 Palpitations: Secondary | ICD-10-CM

## 2020-10-17 DIAGNOSIS — R0600 Dyspnea, unspecified: Secondary | ICD-10-CM

## 2020-10-17 DIAGNOSIS — Z87891 Personal history of nicotine dependence: Secondary | ICD-10-CM

## 2020-10-17 DIAGNOSIS — I10 Essential (primary) hypertension: Secondary | ICD-10-CM

## 2020-10-17 DIAGNOSIS — I2510 Atherosclerotic heart disease of native coronary artery without angina pectoris: Secondary | ICD-10-CM

## 2020-10-17 DIAGNOSIS — Z8616 Personal history of COVID-19: Secondary | ICD-10-CM

## 2020-10-30 NOTE — Progress Notes (Signed)
ID:  Stephanie Harrison, DOB Aug 23, 1975, MRN [id number]  PCP:  Aletha Halim., PA-C  Cardiologist:  Rex Kras, DO, [facility] (established care 05/07/2020)  Date: 10/31/20 Last Office Visit: 07/18/2020  Chief Complaint  Patient presents with  . Palpitations  . Follow-up    Dyspnea on exertion    HPI  Stephanie Harrison is a 45 y.o. female who presents to the office with a chief complaint of " follow-up for dyspnea on exertion and palpitations." Patient's past medical history and cardiovascular risk factors include: Coronary artery calcification, nonobstructive CAD, hypertension, history of COVID-19 infection, former smoker, obesity due to excess calories.  She is referred to the office at the request of Clyde Lundborg PA-C for evaluation of Chest tightness, Dyspnea on exertion, palpitations.  Since establishing care patient has undergone ischemic evaluation.  Coronary CTA noted mild coronary artery calcification and nonobstructive CAD which is not hemodynamically significant based on CT FFR.  Since then she is educated on the importance of improving her modifiable cardiovascular risk factors and uptitrating guideline directed medical therapy.  Patient states that she is doing well since last office visit.  She denies any chest pain or heart failure symptoms.  Her palpitations have resolved on the current dose of metoprolol.  Her effort related dyspnea has significantly improved.  Patient states that her blood pressures at home is also trending better and she has not been to urgent care or ER since last office visit.  Since last visit she now has hearing aids.  FUNCTIONAL STATUS: No structured exercise program or daily routine.   ALLERGIES: Allergies  Allergen Reactions  . Lisinopril Cough    MEDICATION LIST PRIOR TO VISIT: Current Meds  Medication Sig  . Ascorbic Acid (VITAMIN C) 100 MG tablet Take 100 mg by mouth daily.  Marland Kitchen aspirin EC 81 MG tablet Take 1 tablet (81 mg  total) by mouth daily. Swallow whole.  Marland Kitchen atorvastatin (LIPITOR) 10 MG tablet Take 1 tablet (10 mg total) by mouth at bedtime.  . cholecalciferol (VITAMIN D3) 25 MCG (1000 UNIT) tablet Take 1,000 Units by mouth daily.  . hydrochlorothiazide (HYDRODIURIL) 25 MG tablet TAKE 1 TABLET BY MOUTH ONCE DAILY NEED  OFFICE  VISIT  . ibuprofen (ADVIL) 800 MG tablet Take 800 mg by mouth every 8 (eight) hours as needed. For cramps  . LORazepam (ATIVAN) 0.5 MG tablet Take 1 tablet by mouth 3 (three) times daily.  . metoprolol tartrate (LOPRESSOR) 50 MG tablet Take 1 tablet by mouth twice daily  . traMADol (ULTRAM) 50 MG tablet Take 50 mg by mouth in the morning, at noon, and at bedtime.   . [DISCONTINUED] atorvastatin (LIPITOR) 20 MG tablet TAKE 2 TABLETS BY MOUTH AT BEDTIME     PAST MEDICAL HISTORY: Past Medical History:  Diagnosis Date  . Anxiety   . Coronary atherosclerosis due to calcified coronary lesion   . History of 2019 novel coronavirus disease (COVID-19)   . Hypertension   . No pertinent past medical history   . Nonobstructive atherosclerosis of coronary artery     PAST SURGICAL HISTORY: Past Surgical History:  Procedure Laterality Date  . CARPAL TUNNEL RELEASE     bil wrists  . LAPAROSCOPIC TUBAL LIGATION  04/08/2012   Procedure: LAPAROSCOPIC TUBAL LIGATION;  Surgeon: Lahoma Crocker, MD;  Location: [facility];  Service: Gynecology;  Laterality: Bilateral;  . TONSILLECTOMY      FAMILY HISTORY: The patient family history includes Cirrhosis in her father; Heart Problems in her  mother; Hypertension in her father and mother.  SOCIAL HISTORY:  The patient  reports that she quit smoking about 8 years ago. Her smoking use included cigarettes. She started smoking about 28 years ago. She has a 5.00 pack-year smoking history. She has never used smokeless tobacco. She reports current alcohol use. She reports that she does not use drugs.  REVIEW OF SYSTEMS: Review of Systems  Constitutional:  Negative for chills and fever.  HENT: Negative for hoarse voice and nosebleeds.   Eyes: Negative for discharge, double vision and pain.  Cardiovascular: Negative for chest pain, claudication, dyspnea on exertion, leg swelling, near-syncope, orthopnea, palpitations, paroxysmal nocturnal dyspnea and syncope.  Respiratory: Negative for hemoptysis and shortness of breath.   Musculoskeletal: Negative for muscle cramps and myalgias.  Gastrointestinal: Negative for abdominal pain, constipation, diarrhea, hematemesis, hematochezia, melena, nausea and vomiting.  Neurological: Negative for dizziness and light-headedness.    PHYSICAL EXAM: Vitals with BMI 10/31/2020 07/18/2020 06/04/2020  Height 5' 8" 5' 8" -  Weight 254 lbs 253 lbs -  BMI 89.21 19.41 -  Systolic 740 814 481  Diastolic 87 73 97  Pulse 83 84 77   CONSTITUTIONAL: Well-developed and well-nourished. No acute distress.  SKIN: Skin is warm and dry. No rash noted. No cyanosis. No pallor. No jaundice HEAD: Normocephalic and atraumatic.  EYES: No scleral icterus MOUTH/THROAT: Moist oral membranes.  NECK: No JVD present. No thyromegaly noted. No carotid bruits  LYMPHATIC: No visible cervical adenopathy.  CHEST Normal respiratory effort. No intercostal retractions  LUNGS: Clear to auscultation bilaterally.  No stridor. No wheezes. No rales.  CARDIOVASCULAR: Regular rate and rhythm, positive S1-S2, no murmurs rubs or gallops appreciated ABDOMINAL: Obese, soft, nontender, nondistended, positive bowel sounds in all 4 quadrants.  No apparent ascites.  EXTREMITIES: No peripheral edema  HEMATOLOGIC: No significant bruising NEUROLOGIC: Oriented to person, place, and time. Nonfocal. Normal muscle tone.  PSYCHIATRIC: Normal mood and affect. Normal behavior. Cooperative  CARDIAC DATABASE: EKG: 10/31/2020: Normal sinus rhythm, 75 bpm, without underlying ischemia or injury pattern.    Echocardiogram: 05/09/2020:  Normal LV systolic function  with visual EF 55-60%. Left ventricle cavity is normal in size. Normal global wall motion. Normal diastolic filling pattern, normal LAP. No obvious regional wall motion abnormalities.  No significant valvular heart disease.  No prior study for comparison.    Stress Testing: No results found for this or any previous visit from the past 1095 days.  Heart Catheterization: None  CCTA 05/15/2020:  1. Coronary calcium score of 39. This was 98th percentile for age and sex matched control.  2. Normal coronary origin with right dominance.  3. CAD-RADS = 3. Moderate stenosis in the proximal to mid LAD due to mixed plaque. Mild stenosis in the mid LCX due to noncalcified plaque. Mild stenosis of the distal RCA due to calcified plaque.  4. Image quality was poor and there was at least moderate signal to noise artifact. The study had to be performed with retrospective gating due to premature ventricular contractions. 5. CT FFR analysis showed no significant stenosis.  RECOMMENDATIONS:Moderate stenosis. Consider symptom-guided anti-ischemic pharmacotherapy as well as risk factor modification per guideline directed care.  14 day extended Holter monitor: Dominant rhythm normal sinus rhythm. Heart rate 45 - 179 bpm, Avg HR 82 bpm. No atrial fibrillation, supraventricular tachycardia, high grade AV block, pauses (3 seconds or longer). Total ventricular ectopic burden <1%. 1 episode of NSVT 5 beats in duration, average rate of 160 bpm. Total supraventricular ectopic burden <  1%. Patient triggered events: 40.  Underlying rhythm normal sinus followed by sinus tachycardia with rare SVE and ventricular ectopy.  LABORATORY DATA: External Labs: Collected: 05/01/2020 at [facility] health Hemoglobin 13.9 g/dL. D-dimer 340 BNP 12.4 TSH 1.005 Creatinine 0.75 mg/dL. eGFR: >90 mL/min per 1.73 m  Lipid profile: Collected: 07/24/2019 at [facility] obtained from [place] Total cholesterol 193, triglycerides 422, HDL 47, LDL 91, non-HDL 146.  Lab Results  Component Value Date   CHOL 138 10/10/2020   HDL 47 10/10/2020   LDLCALC 75 10/10/2020   LDLDIRECT 73 10/10/2020   TRIG 84 10/10/2020   CMP Latest Ref Rng & Units 10/10/2020 06/07/2020  Glucose 65 - 99 mg/dL 97 106(H)  BUN 6 - 24 mg/dL 16 12  Creatinine 0.57 - 1.00 mg/dL 0.74 0.74  Sodium 134 - 144 mmol/L 144 139  Potassium 3.5 - 5.2 mmol/L 3.8 4.4  Chloride 96 - 106 mmol/L 102 100  CO2 20 - 29 mmol/L 20 24  Calcium 8.7 - 10.2 mg/dL 9.5 9.8  Total Protein 6.0 - 8.5 g/dL 7.0 6.9  Total Bilirubin 0.0 - 1.2 mg/dL 1.9(H) 1.7(H)  Alkaline Phos 44 - 121 IU/L 58 58  AST 0 - 40 IU/L 15 12  ALT 0 - 32 IU/L 17 15    IMPRESSION:    ICD-10-CM   1. Nonobstructive atherosclerosis of coronary artery  I25.10 atorvastatin (LIPITOR) 10 MG tablet    CMP14+EGFR    Lipid Panel With LDL/HDL Ratio    LDL cholesterol, direct  2. DOE (dyspnea on exertion)  R06.00 EKG 12-Lead  3. Palpitations  R00.2   4. Coronary atherosclerosis due to calcified coronary lesion  I25.10    I25.84   5. Benign hypertension  I10   6. History of COVID-19  Z86.16   7. Former smoker  Z87.891   5. Class 2 severe obesity due to excess calories with serious comorbidity and body mass index (BMI) of 38.0 to 38.9 in adult [facility])  E66.01    Z68.38      RECOMMENDATIONS: Janal Haak is a 45 y.o. female whose past medical history and cardiac risk factors include: Coronary artery calcification, nonobstructive CAD, hypertension, history of COVID-19 infection, former smoker, obesity due to excess calories.  Nonobstructive CAD and coronary artery calcification:  Continue aspirin and statin therapy.  Independently reviewed labs from 10/10/2020 with the patient at today's office visit.  Patient denies any angina pectoris or congestive heart failure symptoms.  She is working on her modifiable cardiovascular risk  factors.  Encouraged 30 minutes of moderate intensity exercise as tolerated 5 days a week  Patient is LDL has improved compared to before and therefore the shared decision was to reduce her Lipitor from 40 mg nightly to 10 mg p.o. nightly.  We will reevaluate lipid profile prior to next visit.   Palpitations: Resolved  Continue metoprolol to 50 mg p.o. twice daily.  May consider transition to Toprol-XL at next visit.  We will discuss.  Former smoker: Educated on the importance of continued smoking cessation.  Benign essential hypertension:  Office blood pressure is currently at goal.  Patient is encouraged to keep a log of her blood pressures and to review it with her PCP.  Currently managed by primary care provider.  Low-salt diet recommended.  Will monitor it peripherally.   FINAL MEDICATION LIST END OF ENCOUNTER: Meds ordered this encounter  Medications  . atorvastatin (LIPITOR) 10 MG tablet  Sig: Take 1 tablet (10 mg total) by mouth at bedtime.    Dispense:  90 tablet    Refill:  0     Current Outpatient Medications:  .  Ascorbic Acid (VITAMIN C) 100 MG tablet, Take 100 mg by mouth daily., Disp: , Rfl:  .  aspirin EC 81 MG tablet, Take 1 tablet (81 mg total) by mouth daily. Swallow whole., Disp: 90 tablet, Rfl: 3 .  atorvastatin (LIPITOR) 10 MG tablet, Take 1 tablet (10 mg total) by mouth at bedtime., Disp: 90 tablet, Rfl: 0 .  cholecalciferol (VITAMIN D3) 25 MCG (1000 UNIT) tablet, Take 1,000 Units by mouth daily., Disp: , Rfl:  .  hydrochlorothiazide (HYDRODIURIL) 25 MG tablet, TAKE 1 TABLET BY MOUTH ONCE DAILY NEED  OFFICE  VISIT, Disp: , Rfl: 0 .  ibuprofen (ADVIL) 800 MG tablet, Take 800 mg by mouth every 8 (eight) hours as needed. For cramps, Disp: , Rfl:  .  LORazepam (ATIVAN) 0.5 MG tablet, Take 1 tablet by mouth 3 (three) times daily., Disp: , Rfl:  .  metoprolol tartrate (LOPRESSOR) 50 MG tablet, Take 1 tablet by mouth twice daily, Disp: 180 tablet, Rfl:  0 .  traMADol (ULTRAM) 50 MG tablet, Take 50 mg by mouth in the morning, at noon, and at bedtime. , Disp: , Rfl:   Orders Placed This Encounter  Procedures  . CMP14+EGFR  . Lipid Panel With LDL/HDL Ratio  . LDL cholesterol, direct  . EKG 12-Lead   --Continue cardiac medications as reconciled in final medication list. --Return in about 8 months (around 07/02/2021) for Follow up, CAD, Lipid. Or sooner if needed. --Continue follow-up with your primary care physician regarding the management of your other chronic comorbid conditions.  Patient's questions and concerns were addressed to her satisfaction. She voices understanding of the instructions provided during this encounter.   This note was created using a voice recognition software as a result there may be grammatical errors inadvertently enclosed that do not reflect the nature of this encounter. Every attempt is made to correct such errors.  Rex Kras, [place], [facility]  Pager: [phone] Office: [phone]

## 2020-10-31 ENCOUNTER — Other Ambulatory Visit: Payer: Self-pay

## 2020-10-31 ENCOUNTER — Encounter: Payer: Self-pay | Admitting: Cardiology

## 2020-10-31 ENCOUNTER — Ambulatory Visit: Payer: BC Managed Care – PPO | Admitting: Cardiology

## 2020-10-31 VITALS — BP 131/87 | HR 83 | Temp 97.4°F | Resp 16 | Ht 68.0 in | Wt 254.0 lb

## 2020-10-31 DIAGNOSIS — Z8616 Personal history of COVID-19: Secondary | ICD-10-CM

## 2020-10-31 DIAGNOSIS — R0600 Dyspnea, unspecified: Secondary | ICD-10-CM

## 2020-10-31 DIAGNOSIS — Z87891 Personal history of nicotine dependence: Secondary | ICD-10-CM

## 2020-10-31 DIAGNOSIS — R002 Palpitations: Secondary | ICD-10-CM

## 2020-10-31 DIAGNOSIS — I2510 Atherosclerotic heart disease of native coronary artery without angina pectoris: Secondary | ICD-10-CM

## 2020-10-31 DIAGNOSIS — R0609 Other forms of dyspnea: Secondary | ICD-10-CM

## 2020-10-31 DIAGNOSIS — I2584 Coronary atherosclerosis due to calcified coronary lesion: Secondary | ICD-10-CM

## 2020-10-31 DIAGNOSIS — I10 Essential (primary) hypertension: Secondary | ICD-10-CM

## 2020-10-31 MED ORDER — ATORVASTATIN CALCIUM 10 MG PO TABS: 10.0000 mg | ORAL_TABLET | Freq: Every day | ORAL | 0 refills | Status: DC

## 2020-11-05 ENCOUNTER — Other Ambulatory Visit: Payer: Self-pay

## 2020-11-05 DIAGNOSIS — I2510 Atherosclerotic heart disease of native coronary artery without angina pectoris: Secondary | ICD-10-CM

## 2020-12-11 ENCOUNTER — Other Ambulatory Visit: Payer: Self-pay | Admitting: Cardiology

## 2020-12-11 DIAGNOSIS — R002 Palpitations: Secondary | ICD-10-CM

## 2020-12-11 DIAGNOSIS — I2510 Atherosclerotic heart disease of native coronary artery without angina pectoris: Secondary | ICD-10-CM

## 2021-01-13 ENCOUNTER — Other Ambulatory Visit: Payer: Self-pay | Admitting: Cardiology

## 2021-01-13 DIAGNOSIS — R002 Palpitations: Secondary | ICD-10-CM

## 2021-01-13 DIAGNOSIS — I2510 Atherosclerotic heart disease of native coronary artery without angina pectoris: Secondary | ICD-10-CM

## 2021-02-10 ENCOUNTER — Other Ambulatory Visit: Payer: Self-pay | Admitting: Cardiology

## 2021-02-10 DIAGNOSIS — I2510 Atherosclerotic heart disease of native coronary artery without angina pectoris: Secondary | ICD-10-CM

## 2021-02-11 ENCOUNTER — Other Ambulatory Visit: Payer: Self-pay

## 2021-02-11 DIAGNOSIS — I2510 Atherosclerotic heart disease of native coronary artery without angina pectoris: Secondary | ICD-10-CM

## 2021-02-11 MED ORDER — ATORVASTATIN CALCIUM 10 MG PO TABS: 10.0000 mg | ORAL_TABLET | Freq: Every day | ORAL | 0 refills | Status: DC

## 2021-04-11 ENCOUNTER — Other Ambulatory Visit: Payer: Self-pay | Admitting: Cardiology

## 2021-04-11 DIAGNOSIS — R002 Palpitations: Secondary | ICD-10-CM

## 2021-04-11 DIAGNOSIS — I2510 Atherosclerotic heart disease of native coronary artery without angina pectoris: Secondary | ICD-10-CM

## 2021-06-26 ENCOUNTER — Other Ambulatory Visit: Payer: Self-pay | Admitting: Cardiology

## 2021-06-27 LAB — CMP14+EGFR
ALT: 18 IU/L (ref 0–32)
AST: 17 IU/L (ref 0–40)
Albumin/Globulin Ratio: 2.1 (ref 1.2–2.2)
Albumin: 4.7 g/dL (ref 3.8–4.8)
Alkaline Phosphatase: 57 IU/L (ref 44–121)
BUN/Creatinine Ratio: 22 (ref 9–23)
BUN: 16 mg/dL (ref 6–24)
Bilirubin Total: 1.4 mg/dL — ABNORMAL HIGH (ref 0.0–1.2)
CO2: 25 mmol/L (ref 20–29)
Calcium: 9.6 mg/dL (ref 8.7–10.2)
Chloride: 102 mmol/L (ref 96–106)
Creatinine, Ser: 0.74 mg/dL (ref 0.57–1.00)
Globulin, Total: 2.2 g/dL (ref 1.5–4.5)
Glucose: 96 mg/dL (ref 70–99)
Potassium: 3.9 mmol/L (ref 3.5–5.2)
Sodium: 142 mmol/L (ref 134–144)
Total Protein: 6.9 g/dL (ref 6.0–8.5)
eGFR: 101 mL/min/{1.73_m2} (ref >59)

## 2021-06-27 LAB — LIPID PANEL WITH LDL/HDL RATIO
Cholesterol, Total: 144 mg/dL (ref 100–199)
HDL: 51 mg/dL (ref >39)
LDL Chol Calc (NIH): 68 mg/dL (ref 0–99)
LDL/HDL Ratio: 1.3 ratio (ref 0.0–3.2)
Triglycerides: 142 mg/dL (ref 0–149)
VLDL Cholesterol Cal: 25 mg/dL (ref 5–40)

## 2021-06-27 LAB — LDL CHOLESTEROL, DIRECT: LDL Direct: 78 mg/dL (ref 0–99)

## 2021-07-02 ENCOUNTER — Ambulatory Visit: Payer: BC Managed Care – PPO | Admitting: Cardiology

## 2021-07-03 ENCOUNTER — Ambulatory Visit: Payer: BC Managed Care – PPO | Admitting: Cardiology

## 2021-07-10 ENCOUNTER — Other Ambulatory Visit: Payer: Self-pay | Admitting: Cardiology

## 2021-07-10 DIAGNOSIS — I2510 Atherosclerotic heart disease of native coronary artery without angina pectoris: Secondary | ICD-10-CM

## 2021-07-10 DIAGNOSIS — R002 Palpitations: Secondary | ICD-10-CM

## 2021-08-05 ENCOUNTER — Other Ambulatory Visit: Payer: Self-pay

## 2021-08-05 ENCOUNTER — Ambulatory Visit: Payer: BC Managed Care – PPO | Admitting: Cardiology

## 2021-08-05 ENCOUNTER — Encounter: Payer: Self-pay | Admitting: Cardiology

## 2021-08-05 VITALS — BP 117/80 | HR 85 | Temp 98.7°F | Resp 16 | Ht 68.0 in | Wt 251.8 lb

## 2021-08-05 DIAGNOSIS — I2510 Atherosclerotic heart disease of native coronary artery without angina pectoris: Secondary | ICD-10-CM

## 2021-08-05 DIAGNOSIS — Z87891 Personal history of nicotine dependence: Secondary | ICD-10-CM

## 2021-08-05 DIAGNOSIS — Z8616 Personal history of COVID-19: Secondary | ICD-10-CM

## 2021-08-05 DIAGNOSIS — R002 Palpitations: Secondary | ICD-10-CM

## 2021-08-05 DIAGNOSIS — I2584 Coronary atherosclerosis due to calcified coronary lesion: Secondary | ICD-10-CM

## 2021-08-05 DIAGNOSIS — I10 Essential (primary) hypertension: Secondary | ICD-10-CM

## 2021-08-05 MED ORDER — ASPIRIN EC 81 MG PO TBEC: 81.0000 mg | DELAYED_RELEASE_TABLET | Freq: Every day | ORAL | 3 refills | Status: AC

## 2021-08-05 NOTE — Progress Notes (Signed)
ID:  Stephanie Harrison, DOB 08/02/1975, MRN [id number]  PCP:  Aletha Halim., PA-C  Cardiologist:  Rex Kras, DO, [facility] (established care 05/07/2020)  Date: 08/05/21 Last Office Visit: 10/31/2020  Chief Complaint  Patient presents with   Coronary Artery Disease   Hyperlipidemia   Follow-up    HPI  Stephanie Harrison is a 46 y.o. female who presents to the office with a chief complaint of " 9 month  follow-up for nonobstructive CAD." Patient's past medical history and cardiovascular risk factors include: Coronary artery calcification, nonobstructive CAD, hypertension, history of COVID-19 infection, former smoker, obesity due to excess calories.  Patient was initially referred to the practice for evaluation of chest tightness, dyspnea on exertion and palpitations.  She is undergone work-up for palpitations and subsequently placed on metoprolol which has significantly helped her symptoms and she is currently asymptomatic.  With regards to chest tightness and dyspnea on exertion she did undergo a coronary CTA which noted mild coronary artery calcification and nonobstructive CAD which is not hemodynamically significant based on CT FFR.  Educated on importance of improving her modifiable cardiovascular risk factors.  She now presents for 79-monthfollow-up visit.  She has been stable from a cardiovascular standpoint no hospitalizations or urgent care visits.  She denies angina pectoris or heart failure symptoms.  She is currently on the same medical therapy and no change since last encounter.  FUNCTIONAL STATUS: No structured exercise program or daily routine.   ALLERGIES: Allergies  Allergen Reactions   Lisinopril Cough    MEDICATION LIST PRIOR TO VISIT: Current Meds  Medication Sig   Ascorbic Acid (VITAMIN C) 100 MG tablet Take 100 mg by mouth daily.   atorvastatin (LIPITOR) 10 MG tablet TAKE 1 TABLET BY MOUTH AT BEDTIME   hydrochlorothiazide (HYDRODIURIL) 25 MG tablet TAKE  1 TABLET BY MOUTH ONCE DAILY NEED  OFFICE  VISIT   ibuprofen (ADVIL) 800 MG tablet Take 800 mg by mouth every 8 (eight) hours as needed. For cramps   LORazepam (ATIVAN) 0.5 MG tablet Take 1 tablet by mouth as needed.   Melatonin 5 MG CAPS Take 1 capsule by mouth at bedtime.   metoprolol tartrate (LOPRESSOR) 50 MG tablet Take 1 tablet by mouth twice daily   traMADol (ULTRAM) 50 MG tablet Take 50 mg by mouth in the morning, at noon, and at bedtime.    [DISCONTINUED] aspirin EC 81 MG tablet Take 1 tablet (81 mg total) by mouth daily. Swallow whole. (Patient taking differently: Take 81 mg by mouth daily. Two times daily)     PAST MEDICAL HISTORY: Past Medical History:  Diagnosis Date   Anxiety    Coronary atherosclerosis due to calcified coronary lesion    History of 2019 novel coronavirus disease (COVID-19)    Hypertension    No pertinent past medical history    Nonobstructive atherosclerosis of coronary artery     PAST SURGICAL HISTORY: Past Surgical History:  Procedure Laterality Date   CARPAL TUNNEL RELEASE     bil wrists   LAPAROSCOPIC TUBAL LIGATION  04/08/2012   Procedure: LAPAROSCOPIC TUBAL LIGATION;  Surgeon: LLahoma Crocker MD;  [facility];  Service: Gynecology;  Laterality: Bilateral;   TONSILLECTOMY      FAMILY HISTORY: The patient family history includes Cirrhosis in her father; Heart Problems in her mother; Hypertension in her father and mother.  SOCIAL HISTORY:  The patient  reports that she quit smoking about 9 years ago. Her smoking use included cigarettes. She  started smoking about 29 years ago. She has a 5.00 pack-year smoking history. She has never used smokeless tobacco. She reports current alcohol use. She reports that she does not use drugs.  REVIEW OF SYSTEMS: Review of Systems  Constitutional: Negative for chills and fever.  HENT:  Negative for hoarse voice and nosebleeds.   Eyes:  Negative for discharge, double vision and pain.  Cardiovascular:   Negative for chest pain, claudication, dyspnea on exertion, leg swelling, near-syncope, orthopnea, palpitations, paroxysmal nocturnal dyspnea and syncope.  Respiratory:  Negative for hemoptysis and shortness of breath.   Musculoskeletal:  Negative for muscle cramps and myalgias.  Gastrointestinal:  Negative for abdominal pain, constipation, diarrhea, hematemesis, hematochezia, melena, nausea and vomiting.  Neurological:  Negative for dizziness and light-headedness.   PHYSICAL EXAM: Vitals with BMI 08/05/2021 10/31/2020 07/18/2020  Height 5' 8"  5' 8"  5' 8"   Weight 251 lbs 13 oz 254 lbs 253 lbs  BMI 38.3 29.92 42.68  Systolic 341 962 229  Diastolic 80 87 73  Pulse 85 83 84   CONSTITUTIONAL: Well-developed and well-nourished. No acute distress.  SKIN: Skin is warm and dry. No rash noted. No cyanosis. No pallor. No jaundice HEAD: Normocephalic and atraumatic.  EYES: No scleral icterus MOUTH/THROAT: Moist oral membranes.  NECK: No JVD present. No thyromegaly noted. No carotid bruits  LYMPHATIC: No visible cervical adenopathy.  CHEST Normal respiratory effort. No intercostal retractions  LUNGS: Clear to auscultation bilaterally.  No stridor. No wheezes. No rales.  CARDIOVASCULAR: Regular rate and rhythm, positive S1-S2, no murmurs rubs or gallops appreciated ABDOMINAL: Obese, soft, nontender, nondistended, positive bowel sounds in all 4 quadrants.  No apparent ascites.  EXTREMITIES: No peripheral edema, warm to touch, 2+ DP and PT pulses. HEMATOLOGIC: No significant bruising NEUROLOGIC: Oriented to person, place, and time. Nonfocal. Normal muscle tone.  PSYCHIATRIC: Normal mood and affect. Normal behavior. Cooperative  CARDIAC DATABASE: EKG: 08/05/2021: NSR, 88 bpm, without underlying ischemia or injury pattern.  Echocardiogram: 05/09/2020:  Normal LV systolic function with visual EF 55-60%. Left ventricle cavity is normal in size. Normal global wall motion. Normal diastolic filling  pattern, normal LAP. No obvious regional wall motion abnormalities.  No significant valvular heart disease.  No prior study for comparison.    Stress Testing: No results found for this or any previous visit from the past 1095 days.  Heart Catheterization: None  CCTA 05/15/2020:  1. Coronary calcium score of 39. This was 98th percentile for age and sex matched control.  2. Normal coronary origin with right dominance.  3. CAD-RADS = 3. Moderate stenosis in the proximal to mid LAD due to mixed plaque. Mild stenosis in the mid LCX due to noncalcified plaque. Mild stenosis of the distal RCA due to calcified plaque.  4. Image quality was poor and there was at least moderate signal to noise artifact. The study had to be performed with retrospective gating due to premature ventricular contractions. 5. CT FFR analysis showed no significant stenosis.   RECOMMENDATIONS:Moderate stenosis. Consider symptom-guided anti-ischemic pharmacotherapy as well as risk factor modification per guideline directed care.  14 day extended Holter monitor: Dominant rhythm normal sinus rhythm. Heart rate 45 - 179 bpm, Avg HR 82 bpm. No atrial fibrillation, supraventricular tachycardia, high grade AV block, pauses (3 seconds or longer). Total ventricular ectopic burden <1%. 1 episode of NSVT 5 beats in duration, average rate of 160 bpm. Total supraventricular ectopic burden <1%. Patient triggered events: 40.  Underlying rhythm normal sinus followed by sinus tachycardia with  rare SVE and ventricular ectopy.  LABORATORY DATA: External Labs: Collected: 05/01/2020 at [facility] Hemoglobin 13.9 g/dL. D-dimer 340 BNP 12.4 TSH 1.005 Creatinine 0.75 mg/dL. eGFR: >90 mL/min per 1.73 m  Lipid profile: Collected: 07/24/2019 at [facility] obtained from [employer] Total cholesterol 193, triglycerides 422, HDL 47, LDL 91, non-HDL 146.  Lab Results  Component Value Date   CHOL  144 06/26/2021   HDL 51 06/26/2021   LDLCALC 68 06/26/2021   LDLDIRECT 78 06/26/2021   TRIG 142 06/26/2021   CMP Latest Ref Rng & Units 06/26/2021 10/10/2020 06/07/2020  Glucose 70 - 99 mg/dL 96 97 106(H)  BUN 6 - 24 mg/dL 16 16 12   Creatinine 0.57 - 1.00 mg/dL 0.74 0.74 0.74  Sodium 134 - 144 mmol/L 142 144 139  Potassium 3.5 - 5.2 mmol/L 3.9 3.8 4.4  Chloride 96 - 106 mmol/L 102 102 100  CO2 20 - 29 mmol/L 25 20 24   Calcium 8.7 - 10.2 mg/dL 9.6 9.5 9.8  Total Protein 6.0 - 8.5 g/dL 6.9 7.0 6.9  Total Bilirubin 0.0 - 1.2 mg/dL 1.4(H) 1.9(H) 1.7(H)  Alkaline Phos 44 - 121 IU/L 57 58 58  AST 0 - 40 IU/L 17 15 12   ALT 0 - 32 IU/L 18 17 15     IMPRESSION:    ICD-10-CM   1. Nonobstructive atherosclerosis of coronary artery  I25.10 EKG 12-Lead    aspirin EC 81 MG tablet    2. Coronary atherosclerosis due to calcified coronary lesion  I25.10 aspirin EC 81 MG tablet   I25.84     3. Palpitations  R00.2     4. Benign hypertension  I10     5. Former smoker  Z87.891     23. History of COVID-19  Z86.16     7. Class 2 severe obesity due to excess calories with serious comorbidity and body mass index (BMI) of 38.0 to 38.9 in adult [facility])  E66.01    Z68.38        RECOMMENDATIONS: Stephanie Harrison is a 46 y.o. female whose past medical history and cardiac risk factors include: Coronary artery calcification, nonobstructive CAD, hypertension, history of COVID-19 infection, former smoker, obesity due to excess calories.  Nonobstructive atherosclerosis of coronary artery / Coronary atherosclerosis due to calcified coronary lesion Total coronary calcium score 39, 98th percentile CAD-RADS = 3 as of 04/2020 Denies angina pectoris or heart failure symptoms Most recent lipid profile reviewed labs independently reviewed dating 06/27/2021 Most recent LDL 78 mg/dL. Currently taking aspirin 81 mg p.o. twice daily, for reasons unknown.  Transition to once a day Educated on importance of  increasing physical activity to 30 minutes a day 5 days a week.  Palpitations Remains asymptomatic on metoprolol Patient will call the office when she is due for refill if she would like to transition to Toprol-XL. No additional cardiovascular testing warranted at this time  Benign hypertension Office blood pressures are very well controlled. Reemphasized the importance of a low-salt diet. Currently managed by primary care provider.  Former smoker Educated on the importance of continued smoking cessation.  Class 2 severe obesity due to excess calories with serious comorbidity and body mass index (BMI) of 38.0 to 38.9 in adult [facility]) Body mass index is 38.29 kg/m. I reviewed with the patient the importance of diet, regular physical activity/exercise, weight loss.   Patient is educated on increasing physical activity gradually as tolerated.  With the goal of moderate intensity exercise  for 30 minutes a day 5 days a week.  Independently reviewed labs dating 06/27/2021 with the patient at today's office visit.  FINAL MEDICATION LIST END OF ENCOUNTER: Meds ordered this encounter  Medications   aspirin EC 81 MG tablet    Sig: Take 1 tablet (81 mg total) by mouth daily. Swallow whole.    Dispense:  90 tablet    Refill:  3     Current Outpatient Medications:    Ascorbic Acid (VITAMIN C) 100 MG tablet, Take 100 mg by mouth daily., Disp: , Rfl:    atorvastatin (LIPITOR) 10 MG tablet, TAKE 1 TABLET BY MOUTH AT BEDTIME, Disp: 90 tablet, Rfl: 0   hydrochlorothiazide (HYDRODIURIL) 25 MG tablet, TAKE 1 TABLET BY MOUTH ONCE DAILY NEED  OFFICE  VISIT, Disp: , Rfl: 0   ibuprofen (ADVIL) 800 MG tablet, Take 800 mg by mouth every 8 (eight) hours as needed. For cramps, Disp: , Rfl:    LORazepam (ATIVAN) 0.5 MG tablet, Take 1 tablet by mouth as needed., Disp: , Rfl:    Melatonin 5 MG CAPS, Take 1 capsule by mouth at bedtime., Disp: , Rfl:    metoprolol tartrate (LOPRESSOR) 50 MG tablet, Take 1 tablet  by mouth twice daily, Disp: 180 tablet, Rfl: 0   traMADol (ULTRAM) 50 MG tablet, Take 50 mg by mouth in the morning, at noon, and at bedtime. , Disp: , Rfl:    aspirin EC 81 MG tablet, Take 1 tablet (81 mg total) by mouth daily. Swallow whole., Disp: 90 tablet, Rfl: 3  Orders Placed This Encounter  Procedures   EKG 12-Lead   --Continue cardiac medications as reconciled in final medication list. --Return in about 15 months (around 11/02/2022) for Follow up nonobstructive CAD. Or sooner if needed. --Continue follow-up with your primary care physician regarding the management of your other chronic comorbid conditions.  Patient's questions and concerns were addressed to her satisfaction. She voices understanding of the instructions provided during this encounter.   This note was created using a voice recognition software as a result there may be grammatical errors inadvertently enclosed that do not reflect the nature of this encounter. Every attempt is made to correct such errors.  Total time spent: 22 minutes.  Rex Kras, [place], [facility]  Pager: [phone] Office: [phone]

## 2021-10-08 ENCOUNTER — Other Ambulatory Visit: Payer: Self-pay | Admitting: Cardiology

## 2021-10-08 DIAGNOSIS — I2510 Atherosclerotic heart disease of native coronary artery without angina pectoris: Secondary | ICD-10-CM

## 2021-12-29 ENCOUNTER — Other Ambulatory Visit: Payer: Self-pay | Admitting: Cardiology

## 2021-12-29 DIAGNOSIS — I2510 Atherosclerotic heart disease of native coronary artery without angina pectoris: Secondary | ICD-10-CM

## 2021-12-29 DIAGNOSIS — R002 Palpitations: Secondary | ICD-10-CM

## 2022-04-15 ENCOUNTER — Other Ambulatory Visit: Payer: Self-pay | Admitting: Family Medicine

## 2022-04-15 DIAGNOSIS — Z1231 Encounter for screening mammogram for malignant neoplasm of breast: Secondary | ICD-10-CM

## 2022-04-17 ENCOUNTER — Ambulatory Visit: Payer: BC Managed Care – PPO

## 2022-04-27 ENCOUNTER — Ambulatory Visit
Admission: RE | Admit: 2022-04-27 | Discharge: 2022-04-27 | Disposition: A | Discharge status: Home / Self Care | Payer: 59 | Source: Ambulatory Visit | Attending: Family Medicine | Admitting: Family Medicine

## 2022-04-27 DIAGNOSIS — Z1231 Encounter for screening mammogram for malignant neoplasm of breast: Secondary | ICD-10-CM

## 2022-04-29 ENCOUNTER — Other Ambulatory Visit: Payer: Self-pay | Admitting: Family Medicine

## 2022-04-29 DIAGNOSIS — R928 Other abnormal and inconclusive findings on diagnostic imaging of breast: Secondary | ICD-10-CM

## 2022-05-09 ENCOUNTER — Ambulatory Visit
Admission: RE | Admit: 2022-05-09 | Discharge: 2022-05-09 | Disposition: A | Discharge status: Home / Self Care | Payer: 59 | Source: Ambulatory Visit | Attending: Family Medicine | Admitting: Family Medicine

## 2022-05-09 ENCOUNTER — Other Ambulatory Visit: Payer: Self-pay | Admitting: Family Medicine

## 2022-05-09 DIAGNOSIS — N6320 Unspecified lump in the left breast, unspecified quadrant: Secondary | ICD-10-CM

## 2022-05-09 DIAGNOSIS — R928 Other abnormal and inconclusive findings on diagnostic imaging of breast: Secondary | ICD-10-CM

## 2022-05-17 IMAGING — CT CT HEART MORP W/ CTA COR W/ SCORE W/ CA W/CM &/OR W/O CM
2 of 9 series · 9 of 20 positions shown, 11 images · IV contrast (omnipaque)
Comparison: None.
COMPARISON: None.

Addendum:
EXAM:
OVER-READ INTERPRETATION  CT CHEST

The following report is an over-read performed by radiologist Dr.
Dleke Tiger [REDACTED] on 05/15/2020. This
over-read does not include interpretation of cardiac or coronary
anatomy or pathology. The coronary calcium score/coronary CTA
interpretation by the cardiologist is attached.
HISTORY: 44-year-old Caucasian female whose past medical history and
cardiovascular risk factors include hypertension, history of
18732-JL infection, former smoker, obesity due to excess calories
presents with effort related dyspnea and precordial chest pain.
Cardiac/Coronary  CT
TECHNIQUE: The patient was scanned on a Siemens Force scanner.
PROTOCOL: A 120 kV retrospective scan was triggered in the descending thoracic
aorta at 111 HU's. Axial non-contrast 3 mm slices were carried out
through the heart. The data set was analyzed on a dedicated work
station and scored using the Agatson method. Gantry rotation speed
was 250 msecs and collimation was .6 mm. 5mg of Lopressor IV push
and 0.8 mg of sl NTG was given. The 3D data set was reconstructed in
5% intervals of the 67-82 % of the R-R cycle. Diastolic phases were
analyzed on a dedicated work station using MPR, MIP and VRT modes.
The patient received 80mL OMNIPAQUE IOHEXOL 350 MG/ML SOLN of
contrast.

[Series 8: 0-90% · axial · 0.39mm/px · z∈[+1040,+1119]mm · 4 of 2190 slices shown]
[im 438/2190  vessel]
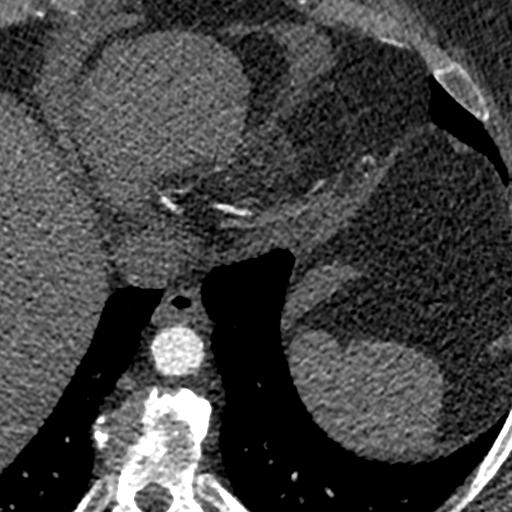
[im 876/2190  vessel]
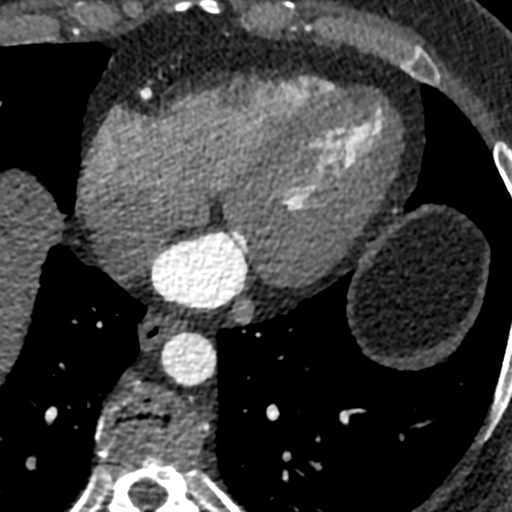
[im 1314/2190  vessel]
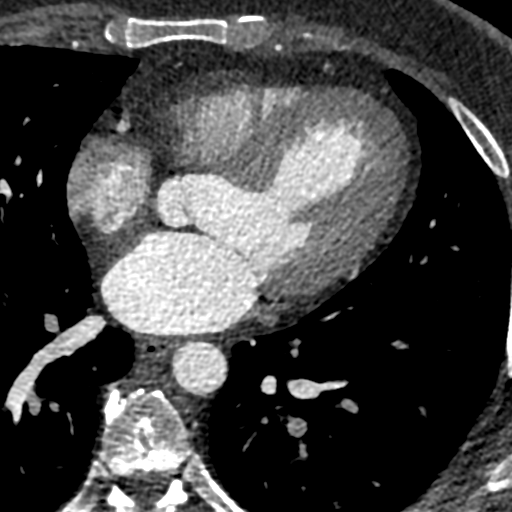
[im 1752/2190  vessel]
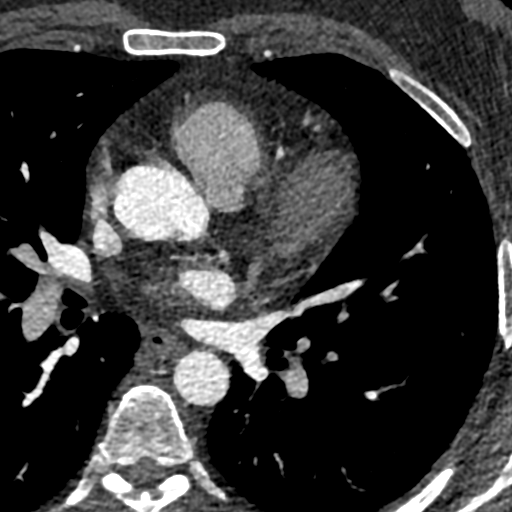

[Series 9: 5-95% · axial · 0.39mm/px · z∈[+1036,+1123]mm · 5 of 2190 slices shown, 7 images]
[im 365/2190  vessel]
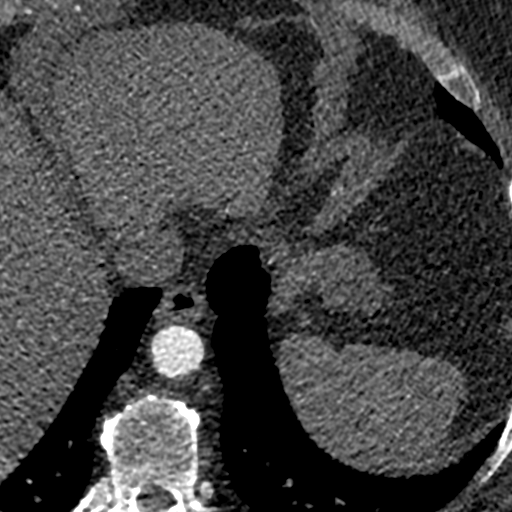
[im 365/2190  lung]
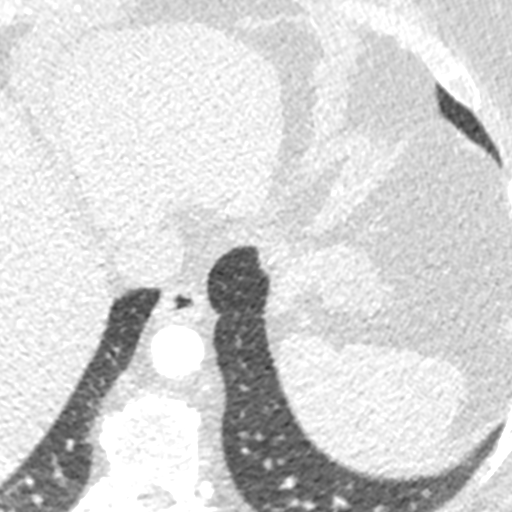
[im 730/2190  vessel]
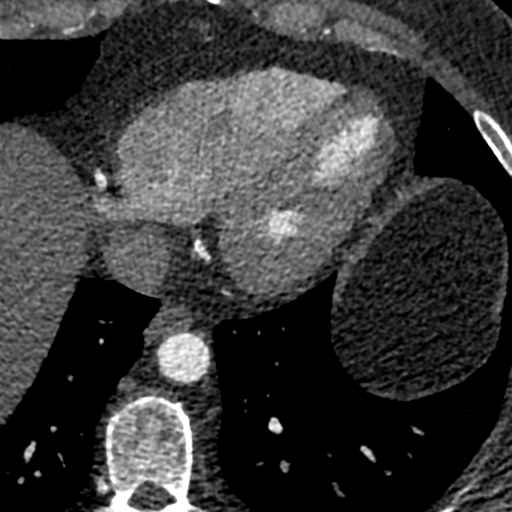
[im 1095/2190  vessel]
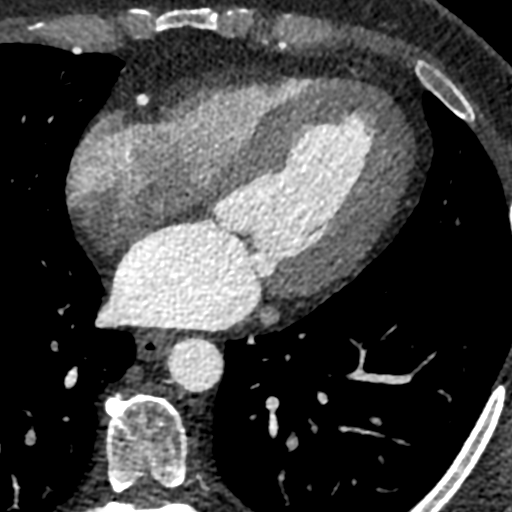
[im 1460/2190  vessel]
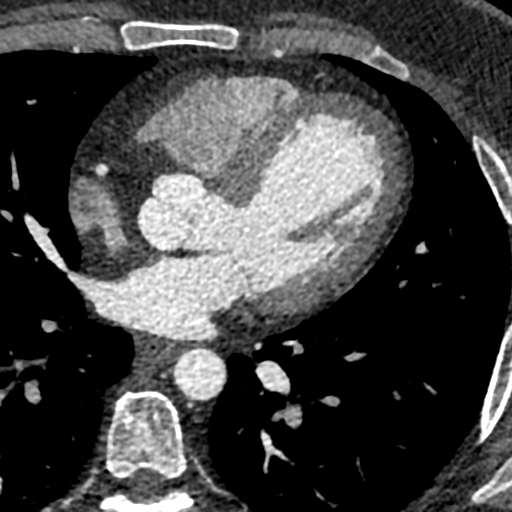
[im 1825/2190  vessel]
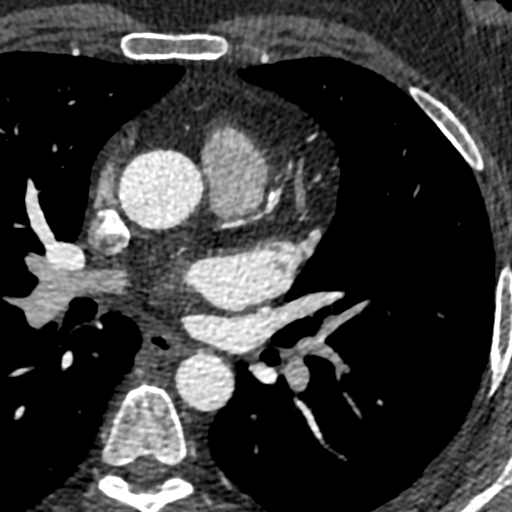
[im 1825/2190  lung]
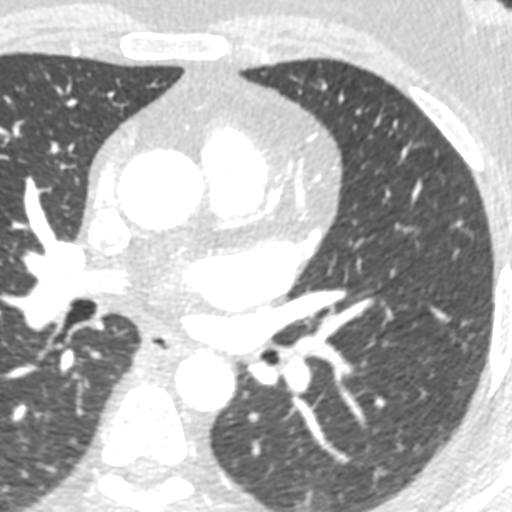

[9 of 20 positions shown; findings below may reference images not displayed]

FINDINGS: Within the visualized portions of the thorax there are no suspicious
appearing pulmonary nodules or masses, there is no acute
consolidative airspace disease, no pleural effusions, no
pneumothorax and no lymphadenopathy. Visualized portions of the
upper abdomen are unremarkable. There are no aggressive appearing
lytic or blastic lesions noted in the visualized portions of the
skeleton.
IMPRESSION: 1. No significant incidental noncardiac findings are noted.
FINDINGS: Image quality: Poor.

Noise artifact is: Moderate due to signal-to-noise (obese).

Coronary artery calcification score: Coronary calcium score is 39,
which places the patient in the 98th percentile for age and sex
matched control.

Coronary arteries: Normal coronary origins.  Right dominance.

Left Main Coronary Artery: The left main is a large caliber vessel
with a normal take off from the left coronary cusp that bifurcates
to form a left anterior descending artery and a left circumflex
artery. There is no plaque or stenosis.

Left Anterior Descending Coronary Artery: The LAD normal caliber
vessel that does not wrap the apex. It gives off 2 patent diagonal
branches. Moderate stenosis (50-69%) in the proximal to mid LAD due
to mixed plaque just after the first diagonal branch. Both diagonal
branches are patent without obvious evidence of plaque or stenosis.

Left Circumflex Artery: The LCX is non-dominant vessel that courses
the AV groove and gives off 2 patent obtuse marginal branches. Mild
stenosis (25-49%) in the mid LCX due to non-calcified plaque. Both
obtuse marginal branches are patent without any significant plaque
or stenosis.

Right Coronary Artery: The RCA is dominant with normal take off from
the right coronary cusp. The RCA terminates as a PDA and right
posterolateral branch. Mild stenosis (25-49%) in the distal RCA due
to calcified plaque. Without evidence of plaque or stenosis.

Left Atrium: Left atrium is grossly normal in size with no left
atrial appendage filling defect.

Left Ventricle: The ventricular cavity size is grossly within normal
limits. There are no stigmata of prior infarction. There is no
abnormal filling defect.

Pulmonary arteries: Normal in size without proximal filling defect.

Pulmonary veins: Normal pulmonary venous drainage.

Aorta: Normal size, 30 mm at the mid ascending aorta (level of the
PA bifurcation) measured double oblique. No calcifications. No
dissection.

Pericardium: Normal thickness with no significant effusion or
calcium present.

Cardiac valves: The aortic valve is trileaflet without significant
calcification. The mitral valve is normal structure without
significant calcification.

Extra-cardiac findings: See attached radiology report for
non-cardiac structures.
IMPRESSION: 1. Coronary calcium score of 39. This was 98th percentile for age
and sex matched control.

2. Normal coronary origin with right dominance.

3. CAD-RADS = 3. Moderate stenosis in the proximal to mid LAD due to
mixed plaque. Mild stenosis in the mid LCX due to noncalcified
plaque. Mild stenosis of the distal RCA due to calcified plaque.

4. Study is sent for CT-FFR findings will be performed and reported
separately.

5. Image quality was poor and there was at least moderate signal to
noise artifact. The study had to be performed with retrospective
gating due to premature ventricular contractions.

RECOMMENDATIONS:

Moderate stenosis. Consider symptom-guided anti-ischemic
pharmacotherapy as well as risk factor modification per guideline
directed care. Additional analysis with CT FFR will be submitted.

*** End of Addendum ***
EXAM:
OVER-READ INTERPRETATION  CT CHEST

The following report is an over-read performed by radiologist Dr.
Dleke Tiger [REDACTED] on 05/15/2020. This
over-read does not include interpretation of cardiac or coronary
anatomy or pathology. The coronary calcium score/coronary CTA
interpretation by the cardiologist is attached.
FINDINGS: Within the visualized portions of the thorax there are no suspicious
appearing pulmonary nodules or masses, there is no acute
consolidative airspace disease, no pleural effusions, no
pneumothorax and no lymphadenopathy. Visualized portions of the
upper abdomen are unremarkable. There are no aggressive appearing
lytic or blastic lesions noted in the visualized portions of the
skeleton.
IMPRESSION: 1. No significant incidental noncardiac findings are noted.

## 2022-05-27 ENCOUNTER — Ambulatory Visit
Admission: RE | Admit: 2022-05-27 | Discharge: 2022-05-27 | Disposition: A | Discharge status: Home / Self Care | Payer: 59 | Source: Ambulatory Visit | Attending: Family Medicine | Admitting: Family Medicine

## 2022-05-27 DIAGNOSIS — N6320 Unspecified lump in the left breast, unspecified quadrant: Secondary | ICD-10-CM

## 2022-05-27 HISTORY — PX: BREAST BIOPSY: SHX20

## 2022-08-02 ENCOUNTER — Other Ambulatory Visit: Payer: Self-pay | Admitting: Cardiology

## 2022-08-02 DIAGNOSIS — I2510 Atherosclerotic heart disease of native coronary artery without angina pectoris: Secondary | ICD-10-CM

## 2022-11-02 ENCOUNTER — Encounter: Payer: Self-pay | Admitting: Cardiology

## 2022-11-02 ENCOUNTER — Ambulatory Visit: Payer: 59 | Admitting: Cardiology

## 2022-11-02 VITALS — BP 116/74 | HR 89 | Resp 18 | Ht 68.0 in | Wt 263.6 lb

## 2022-11-02 DIAGNOSIS — Z8616 Personal history of COVID-19: Secondary | ICD-10-CM

## 2022-11-02 DIAGNOSIS — Z87891 Personal history of nicotine dependence: Secondary | ICD-10-CM

## 2022-11-02 DIAGNOSIS — I10 Essential (primary) hypertension: Secondary | ICD-10-CM

## 2022-11-02 DIAGNOSIS — I2510 Atherosclerotic heart disease of native coronary artery without angina pectoris: Secondary | ICD-10-CM

## 2022-11-02 DIAGNOSIS — R002 Palpitations: Secondary | ICD-10-CM

## 2022-11-02 MED ORDER — METOPROLOL SUCCINATE ER 50 MG PO TB24
50.0000 mg | ORAL_TABLET | Freq: Every morning | ORAL | 0 refills | Status: DC
Start: 2022-11-02 — End: 2023-02-01

## 2022-11-02 MED ORDER — ATORVASTATIN CALCIUM 20 MG PO TABS
20.0000 mg | ORAL_TABLET | Freq: Every day | ORAL | 0 refills | Status: DC
Start: 2022-11-02 — End: 2023-02-01

## 2022-11-02 NOTE — Progress Notes (Unsigned)
ID:  Stephanie Harrison, DOB [birth year], MRN [id number]  PCP:  Richmond Campbell., PA-C  Cardiologist:  Tessa Lerner, DO, [facility] (established care 05/07/2020)  Date: 11/02/22 Last Office Visit: 08/05/2021  Chief Complaint  Patient presents with   Coronary Artery Disease    nonobstructive   Follow-up    15 months    HPI  Kelcee Bjorn is a 47 y.o. female whose past medical history and cardiovascular risk factors include: Coronary artery calcification, nonobstructive CAD, hypertension, history of COVID-19 infection, former smoker, obesity due to excess calories.  Patient was referred to practice for evaluation of chest tightness/dyspnea/palpitations.  With regards to palpitations she underwent appropriate workup and was placed on metoprolol and has done well on medical therapy.  With regards to chest tightness/dyspnea workup she did undergo coronary CTA was noted to have mild CAC and nonobstructive CAD which was not hemodynamically significant based on CT FFR results.  He presents today for 1 year follow-up visit.  FUNCTIONAL STATUS: No structured exercise program or daily routine.   ALLERGIES: Allergies  Allergen Reactions   Lisinopril Cough    MEDICATION LIST PRIOR TO VISIT: Current Meds  Medication Sig   Ascorbic Acid (VITAMIN C) 100 MG tablet Take 100 mg by mouth daily.   aspirin EC 81 MG tablet Take 1 tablet (81 mg total) by mouth daily. Swallow whole.   atorvastatin (LIPITOR) 10 MG tablet TAKE 1 TABLET BY MOUTH AT BEDTIME   Ergocalciferol 10 MCG (400 UNIT) TABS Take by mouth.   fluticasone (FLONASE) 50 MCG/ACT nasal spray Place 1 spray into both nostrils daily.   hydrochlorothiazide (HYDRODIURIL) 25 MG tablet TAKE 1 TABLET BY MOUTH ONCE DAILY NEED  OFFICE  VISIT   ibuprofen (ADVIL) 800 MG tablet Take 800 mg by mouth every 8 (eight) hours as needed. For cramps   LORazepam (ATIVAN) 0.5 MG tablet Take 1 tablet by mouth as needed.   Melatonin 5 MG CAPS Take 1 capsule  by mouth at bedtime.   metoprolol tartrate (LOPRESSOR) 50 MG tablet Take 1 tablet by mouth twice daily   traMADol (ULTRAM) 50 MG tablet Take 50 mg by mouth in the morning, at noon, and at bedtime.    venlafaxine XR (EFFEXOR-XR) 37.5 MG 24 hr capsule Take 37.5 mg by mouth daily with breakfast.     PAST MEDICAL HISTORY: Past Medical History:  Diagnosis Date   Anxiety    Coronary atherosclerosis due to calcified coronary lesion    History of 2019 novel coronavirus disease (COVID-19)    Hypertension    No pertinent past medical history    Nonobstructive atherosclerosis of coronary artery     PAST SURGICAL HISTORY: Past Surgical History:  Procedure Laterality Date   BREAST BIOPSY Left 05/27/2022   MM LT BREAST BX W LOC DEV 1ST LESION IMAGE BX SPEC STEREO GUIDE 05/27/2022 [facility]   CARPAL TUNNEL RELEASE     bil wrists   LAPAROSCOPIC TUBAL LIGATION  04/08/2012   Procedure: LAPAROSCOPIC TUBAL LIGATION;  Surgeon: Antionette Char, MD;  Location: [facility];  Service: Gynecology;  Laterality: Bilateral;   TONSILLECTOMY      FAMILY HISTORY: The patient family history includes Cirrhosis in her father; Heart Problems in her mother; Hypertension in her father and mother.  SOCIAL HISTORY:  The patient  reports that she quit smoking about 10 years ago. Her smoking use included cigarettes. She started smoking about 30 years ago. She has a 5.00 pack-year smoking history. She has never used  smokeless tobacco. She reports current alcohol use. She reports that she does not use drugs.  REVIEW OF SYSTEMS: Review of Systems  Constitutional: Negative for chills and fever.  HENT:  Negative for hoarse voice and nosebleeds.   Eyes:  Negative for discharge, double vision and pain.  Cardiovascular:  Negative for chest pain, claudication, dyspnea on exertion, leg swelling, near-syncope, orthopnea, palpitations, paroxysmal nocturnal dyspnea and syncope.  Respiratory:  Negative for hemoptysis and  shortness of breath.   Musculoskeletal:  Negative for muscle cramps and myalgias.  Gastrointestinal:  Negative for abdominal pain, constipation, diarrhea, hematemesis, hematochezia, melena, nausea and vomiting.  Neurological:  Negative for dizziness and light-headedness.    PHYSICAL EXAM:    11/02/2022    1:38 PM 08/05/2021    1:35 PM 10/31/2020    1:54 PM  Vitals with BMI  Height     Weight 263 lbs 10 oz 251 lbs 13 oz 254 lbs  BMI 40.09 38.3 38.63  Systolic 116 117 409  Diastolic 74 80 87  Pulse 89 85 83   Physical Exam  Constitutional: No distress.  Age appropriate, hemodynamically stable.   Neck: No JVD present.  Cardiovascular: Normal rate, regular rhythm, S1 normal, S2 normal, intact distal pulses and normal pulses. Exam reveals no gallop, no S3 and no S4.  No murmur heard. Pulses:      Dorsalis pedis pulses are 2+ on the right side and 2+ on the left side.       Posterior tibial pulses are 2+ on the right side and 2+ on the left side.  Pulmonary/Chest: Effort normal and breath sounds normal. No stridor. She has no wheezes. She has no rales.  Abdominal: Soft. Bowel sounds are normal. She exhibits no distension. There is no abdominal tenderness.  Musculoskeletal:        General: No edema.     Cervical back: Neck supple.  Neurological: She is alert and oriented to person, place, and time. She has intact cranial nerves (2-12).  Skin: Skin is warm and moist.   CARDIAC DATABASE: EKG: 11/02/2022 Sinus Rhythm, 82bpm, Low voltage in precordial leads, consider old anterior infarct, no change compared to prior tracing.   Echocardiogram: 05/09/2020:  Normal LV systolic function with visual EF 55-60%. Left ventricle cavity is normal in size. Normal global wall motion. Normal diastolic filling pattern, normal LAP. No obvious regional wall motion abnormalities.  No significant valvular heart disease.  No prior study for comparison.    Stress Testing: No results  found for this or any previous visit from the past 1095 days.  Heart Catheterization: None  CCTA 05/15/2020:  1. Coronary calcium score of 39. This was 98th percentile for age and sex matched control.  2. Normal coronary origin with right dominance.  3. CAD-RADS = 3. Moderate stenosis in the proximal to mid LAD due to mixed plaque. Mild stenosis in the mid LCX due to noncalcified plaque. Mild stenosis of the distal RCA due to calcified plaque.  4. Image quality was poor and there was at least moderate signal to noise artifact. The study had to be performed with retrospective gating due to premature ventricular contractions. 5. CT FFR analysis showed no significant stenosis.   RECOMMENDATIONS:Moderate stenosis. Consider symptom-guided anti-ischemic pharmacotherapy as well as risk factor modification per guideline directed care.  14 day extended Holter monitor: Dominant rhythm normal sinus rhythm. Heart rate 45 - 179 bpm, Avg HR 82 bpm. No atrial fibrillation, supraventricular tachycardia, high grade AV block,  pauses (3 seconds or longer). Total ventricular ectopic burden <1%. 1 episode of NSVT 5 beats in duration, average rate of 160 bpm. Total supraventricular ectopic burden <1%. Patient triggered events: 40.  Underlying rhythm normal sinus followed by sinus tachycardia with rare SVE and ventricular ectopy.  LABORATORY DATA: External Labs: Collected: 05/01/2020 at [facility] Hemoglobin 13.9 g/dL. D-dimer 340 BNP 12.4 TSH 1.005 Creatinine 0.75 mg/dL. eGFR: >90 mL/min per 1.73 m  Lipid profile: Collected: 07/24/2019 at [facility] obtained from [facility] Total cholesterol 193, triglycerides 422, HDL 47, LDL 91, non-HDL 146.  External Labs: Collected: 04/15/2022 performed at [facility]. Total cholesterol 153, triglycerides 132, HDL 51, LDL direct 95, non-HDL 102. Sodium 139, potassium 4, chloride 103, bicarb 28,  BUN 17, creatinine 0.73. AST 13, ALT 13, alkaline phosphatase 46 TSH 0.88 WBC 12.5, hemoglobin 13.8, hematocrit 39.8%, platelets 324  Lab Results  Component Value Date   CHOL 144 06/26/2021   HDL 51 06/26/2021   LDLCALC 68 06/26/2021   LDLDIRECT 78 06/26/2021   TRIG 142 06/26/2021      Latest Ref Rng & Units 06/26/2021   10:51 AM 10/10/2020   11:46 AM 06/07/2020    8:57 AM  CMP  Glucose 70 - 99 mg/dL 96  97  829   BUN 6 - 24 mg/dL Creatinine 0.57 - 1.00 mg/dL 5.62  1.30  8.65   Sodium 134 - 144 mmol/L 142  144  139   Potassium 3.5 - 5.2 mmol/L 3.9  3.8  4.4   Chloride 96 - 106 mmol/L 102  102  100   CO2 20 - 29 mmol/L Calcium 8.7 - 10.2 mg/dL 9.6  9.5  9.8   Total Protein 6.0 - 8.5 g/dL 6.9  7.0  6.9   Total Bilirubin 0.0 - 1.2 mg/dL 1.4  1.9  1.7   Alkaline Phos 44 - 121 IU/L 57  58  58   AST 0 - 40 IU/L ALT 0 - 32 IU/L IMPRESSION:    ICD-10-CM   1. Nonobstructive atherosclerosis of coronary artery  I25.10 EKG 12-Lead    2. Coronary atherosclerosis due to calcified coronary lesion  I25.10 EKG 12-Lead   I25.84     3. Palpitations  R00.2 EKG 12-Lead    4. Benign hypertension  I10     5. Former smoker  Z87.891     6. History of COVID-19  Z86.16     7. Class 2 severe obesity due to excess calories with serious comorbidity and body mass index (BMI) of 38.0 to 38.9 in adult  E66.01    Z68.38        RECOMMENDATIONS: Arriona Prest is a 47 y.o. female whose past medical history and cardiac risk factors include: Coronary artery calcification, nonobstructive CAD, hypertension, history of COVID-19 infection, former smoker, obesity due to excess calories.  Nonobstructive atherosclerosis of coronary artery ***  Coronary atherosclerosis due to calcified coronary lesion ***  Palpitations ***  Benign hypertension ***  Former smoker ***  History of COVID-19 ***  Class 2 severe obesity due to excess  calories with serious comorbidity and body mass index (BMI) of 38.0 to 38.9 in adult ***   FINAL MEDICATION LIST END OF ENCOUNTER: No orders of the defined types were placed in  this encounter.    Current Outpatient Medications:    Ascorbic Acid (VITAMIN C) 100 MG tablet, Take 100 mg by mouth daily., Disp: , Rfl:    aspirin EC 81 MG tablet, Take 1 tablet (81 mg total) by mouth daily. Swallow whole., Disp: 90 tablet, Rfl: 3   atorvastatin (LIPITOR) 10 MG tablet, TAKE 1 TABLET BY MOUTH AT BEDTIME, Disp: 90 tablet, Rfl: 0   Ergocalciferol 10 MCG (400 UNIT) TABS, Take by mouth., Disp: , Rfl:    fluticasone (FLONASE) 50 MCG/ACT nasal spray, Place 1 spray into both nostrils daily., Disp: , Rfl:    hydrochlorothiazide (HYDRODIURIL) 25 MG tablet, TAKE 1 TABLET BY MOUTH ONCE DAILY NEED  OFFICE  VISIT, Disp: , Rfl: 0   ibuprofen (ADVIL) 800 MG tablet, Take 800 mg by mouth every 8 (eight) hours as needed. For cramps, Disp: , Rfl:    LORazepam (ATIVAN) 0.5 MG tablet, Take 1 tablet by mouth as needed., Disp: , Rfl:    Melatonin 5 MG CAPS, Take 1 capsule by mouth at bedtime., Disp: , Rfl:    metoprolol tartrate (LOPRESSOR) 50 MG tablet, Take 1 tablet by mouth twice daily, Disp: 180 tablet, Rfl: 0   traMADol (ULTRAM) 50 MG tablet, Take 50 mg by mouth in the morning, at noon, and at bedtime. , Disp: , Rfl:    venlafaxine XR (EFFEXOR-XR) 37.5 MG 24 hr capsule, Take 37.5 mg by mouth daily with breakfast., Disp: , Rfl:   Orders Placed This Encounter  Procedures   EKG 12-Lead   --Continue cardiac medications as reconciled in final medication list. --No follow-ups on file. Or sooner if needed. --Continue follow-up with your primary care physician regarding the management of your other chronic comorbid conditions.  Patient's questions and concerns were addressed to her satisfaction. She voices understanding of the instructions provided during this encounter.   This note was created using a voice recognition  software as a result there may be grammatical errors inadvertently enclosed that do not reflect the nature of this encounter. Every attempt is made to correct such errors.  Total time spent: 22 minutes.  Tessa Lerner, [place], [facility]  Pager: [phone] Office: [phone]

## 2022-11-06 ENCOUNTER — Other Ambulatory Visit: Payer: Self-pay | Admitting: Cardiology

## 2022-11-06 DIAGNOSIS — I2510 Atherosclerotic heart disease of native coronary artery without angina pectoris: Secondary | ICD-10-CM

## 2022-12-02 ENCOUNTER — Other Ambulatory Visit: Payer: Self-pay | Admitting: Cardiology

## 2022-12-03 LAB — CMP14+EGFR
ALT: 15 IU/L (ref 0–32)
AST: 14 IU/L (ref 0–40)
Albumin/Globulin Ratio: 2.5 — ABNORMAL HIGH (ref 1.2–2.2)
Albumin: 4.3 g/dL (ref 3.9–4.9)
Alkaline Phosphatase: 49 IU/L (ref 44–121)
BUN/Creatinine Ratio: 27 — ABNORMAL HIGH (ref 9–23)
BUN: 16 mg/dL (ref 6–24)
Bilirubin Total: 1.2 mg/dL (ref 0.0–1.2)
CO2: 23 mmol/L (ref 20–29)
Calcium: 9 mg/dL (ref 8.7–10.2)
Chloride: 102 mmol/L (ref 96–106)
Creatinine, Ser: 0.6 mg/dL (ref 0.57–1.00)
Globulin, Total: 1.7 g/dL (ref 1.5–4.5)
Glucose: 91 mg/dL (ref 70–99)
Potassium: 3.7 mmol/L (ref 3.5–5.2)
Sodium: 139 mmol/L (ref 134–144)
Total Protein: 6 g/dL (ref 6.0–8.5)
eGFR: 111 mL/min/{1.73_m2} (ref >59)

## 2022-12-03 LAB — LIPID PANEL WITH LDL/HDL RATIO
Cholesterol, Total: 122 mg/dL (ref 100–199)
HDL: 48 mg/dL (ref >39)
LDL Chol Calc (NIH): 50 mg/dL (ref 0–99)
LDL/HDL Ratio: 1 ratio (ref 0.0–3.2)
Triglycerides: 139 mg/dL (ref 0–149)
VLDL Cholesterol Cal: 24 mg/dL (ref 5–40)

## 2022-12-03 LAB — LDL CHOLESTEROL, DIRECT: LDL Direct: 61 mg/dL (ref 0–99)

## 2023-01-30 ENCOUNTER — Other Ambulatory Visit: Payer: Self-pay | Admitting: Cardiology

## 2023-01-30 DIAGNOSIS — R002 Palpitations: Secondary | ICD-10-CM

## 2023-01-30 DIAGNOSIS — I2510 Atherosclerotic heart disease of native coronary artery without angina pectoris: Secondary | ICD-10-CM

## 2023-05-01 ENCOUNTER — Other Ambulatory Visit: Payer: Self-pay | Admitting: Cardiology

## 2023-05-01 DIAGNOSIS — I2510 Atherosclerotic heart disease of native coronary artery without angina pectoris: Secondary | ICD-10-CM

## 2023-05-01 DIAGNOSIS — R002 Palpitations: Secondary | ICD-10-CM

## 2023-07-29 ENCOUNTER — Other Ambulatory Visit: Payer: Self-pay | Admitting: Cardiology

## 2023-07-29 DIAGNOSIS — R002 Palpitations: Secondary | ICD-10-CM

## 2023-07-29 DIAGNOSIS — I2510 Atherosclerotic heart disease of native coronary artery without angina pectoris: Secondary | ICD-10-CM

## 2023-10-26 ENCOUNTER — Other Ambulatory Visit: Payer: Self-pay

## 2023-10-26 DIAGNOSIS — R002 Palpitations: Secondary | ICD-10-CM

## 2023-10-26 MED ORDER — METOPROLOL SUCCINATE ER 50 MG PO TB24: ORAL_TABLET | ORAL | 0 refills | Status: DC

## 2023-10-27 ENCOUNTER — Other Ambulatory Visit: Payer: Self-pay | Admitting: Cardiology

## 2023-10-27 DIAGNOSIS — I2510 Atherosclerotic heart disease of native coronary artery without angina pectoris: Secondary | ICD-10-CM

## 2023-11-02 ENCOUNTER — Ambulatory Visit: Payer: Self-pay | Admitting: Cardiology

## 2023-12-01 ENCOUNTER — Other Ambulatory Visit: Payer: Self-pay

## 2023-12-01 DIAGNOSIS — R002 Palpitations: Secondary | ICD-10-CM

## 2023-12-01 MED ORDER — METOPROLOL SUCCINATE ER 50 MG PO TB24: ORAL_TABLET | ORAL | 0 refills | Status: DC

## 2023-12-01 NOTE — Telephone Encounter (Signed)
 Pt is requesting a refill on Metoprolol  Er 50 mg. Pt has gotten final attempt for med. Pt also canceled 11/02/23 appt in office.   Please advice on refill for pt.

## 2023-12-05 NOTE — Telephone Encounter (Signed)
 Please discuss w/ the patient if PCP will refill her medications. If yes, defer to them.   If we are filling give only enough doses until the next visit.   Stephanie Arizmendi [place], DO, FACC

## 2023-12-06 MED ORDER — METOPROLOL SUCCINATE ER 50 MG PO TB24: ORAL_TABLET | ORAL | 0 refills | Status: DC

## 2023-12-06 NOTE — Telephone Encounter (Signed)
 Spoke with pt over the phone who had wanted to set up a f/u appt with Dr. Albert Huff. Set up appt for 5/28 at 2 PM. Sent in a 30 day supply of Toprol -XL.

## 2023-12-15 ENCOUNTER — Ambulatory Visit: Payer: Self-pay | Admitting: Cardiology

## 2024-01-26 ENCOUNTER — Ambulatory Visit: Payer: Self-pay | Attending: Cardiology | Admitting: Cardiology

## 2024-01-26 VITALS — BP 130/88 | HR 89 | Resp 16 | Ht 68.0 in | Wt 254.6 lb

## 2024-01-26 DIAGNOSIS — E66812 Obesity, class 2: Secondary | ICD-10-CM

## 2024-01-26 DIAGNOSIS — I2510 Atherosclerotic heart disease of native coronary artery without angina pectoris: Secondary | ICD-10-CM | POA: Diagnosis not present

## 2024-01-26 DIAGNOSIS — I10 Essential (primary) hypertension: Secondary | ICD-10-CM

## 2024-01-26 DIAGNOSIS — Z87891 Personal history of nicotine dependence: Secondary | ICD-10-CM

## 2024-01-26 DIAGNOSIS — R002 Palpitations: Secondary | ICD-10-CM

## 2024-01-26 DIAGNOSIS — I2584 Coronary atherosclerosis due to calcified coronary lesion: Secondary | ICD-10-CM

## 2024-01-26 DIAGNOSIS — Z6838 Body mass index (BMI) 38.0-38.9, adult: Secondary | ICD-10-CM

## 2024-01-26 NOTE — Progress Notes (Signed)
 Cardiology Office Note:  .   ID:  Stephanie Harrison, DOB 1976/03/07, MRN [id number] PCP:  Debrah Josette MOHR PA-C  Former Cardiology Providers: None [facility] Providers Cardiologist:  Madonna Large, DO , [facility] (established care 01/26/24) Electrophysiologist:  None  Click to update primary MD,subspecialty MD or APP then REFRESH:1}    Chief Complaint  Patient presents with   Nonobstructive atherosclerosis of coronary artery   Follow-up    History of Present Illness: .   Stephanie Harrison is a 48 y.o. Caucasian female whose past medical history and cardiovascular risk factors includes: Coronary artery calcification, nonobstructive CAD, hypertension, history of COVID-19 infection, former smoker, obesity due to excess calories.   In the past patient was endorsing symptoms of chest tightness/dyspnea and did undergo coronary CTA which noted mild CAC nonobstructive disease and no hemodynamically significant stenosis based on CT FFR.  She was last seen in the office in April 2024 and now presents for approximately 1.5-year follow-up.  Since last office visit patient denies any anginal chest pain or heart failure symptoms.  No hospitalizations or urgent care visits for cardiovascular reasons.  She has been compliant with her medical therapy.  No significant weight gain.  Physical endurance remains stable (walks 9000 steps/day). Home SBP are not being checked regularly..   Review of Systems: .   Review of Systems  Constitutional: Positive for weight loss.  Cardiovascular:  Negative for chest pain, claudication, irregular heartbeat, leg swelling, near-syncope, orthopnea, palpitations, paroxysmal nocturnal dyspnea and syncope.  Respiratory:  Negative for shortness of breath.   Hematologic/Lymphatic: Negative for bleeding problem.    Studies Reviewed:   EKG: EKG Interpretation Date/Time:  Wednesday January 26 2024 11:12:37 EDT Ventricular Rate:  83 PR Interval:  150 QRS  Duration:  92 QT Interval:  370 QTC Calculation: 434 R Axis:   -19  Text Interpretation: Normal sinus rhythm Normal ECG No previous ECGs available Confirmed by Large Madonna [phone]) on 01/26/2024 11:24:12 AM  Echocardiogram: 05/09/2020:  Normal LV systolic function with visual EF 55-60%. Left ventricle cavity is normal in size. Normal global wall motion. Normal diastolic filling pattern, normal LAP. No obvious regional wall motion abnormalities.  No significant valvular heart disease.  No prior study for comparison.      CCTA 05/15/2020:  1. Coronary calcium  score of 39. This was 98th percentile for age and sex matched control.  2. Normal coronary origin with right dominance.  3. CAD-RADS = 3. Moderate stenosis in the proximal to mid LAD due to mixed plaque. Mild stenosis in the mid LCX due to noncalcified plaque. Mild stenosis of the distal RCA due to calcified plaque.  4. Image quality was poor and there was at least moderate signal to noise artifact. The study had to be performed with retrospective gating due to premature ventricular contractions. 5. CT FFR analysis showed no significant stenosis.   RECOMMENDATIONS:Moderate stenosis. Consider symptom-guided anti-ischemic pharmacotherapy as well as risk factor modification per guideline directed care.   14 day extended Holter monitor: Dominant rhythm normal sinus rhythm. Heart rate 45 - 179 bpm, Avg HR 82 bpm. No atrial fibrillation, supraventricular tachycardia, high grade AV block, pauses (3 seconds or longer). Total ventricular ectopic burden <1%. 1 episode of NSVT 5 beats in duration, average rate of 160 bpm. Total supraventricular ectopic burden <1%. Patient triggered events: 40.  Underlying rhythm normal sinus followed by sinus tachycardia with rare SVE and ventricular ectopy.  RADIOLOGY: NA  Risk Assessment/Calculations:   NA  Labs:    External Labs: Collected: 05/01/2020 at [facility] Hemoglobin 13.9  g/dL. D-dimer 340 BNP 12.4 TSH 1.005 Creatinine 0.75 mg/dL. eGFR: >90 mL/min per 1.73 m   Lipid profile: Collected: 07/24/2019 at [facility] obtained from [facility] Total cholesterol 193, triglycerides 422, HDL 47, LDL 91, non-HDL 146.   External Labs: Collected: 04/15/2022 performed at [facility]. Total cholesterol 153, triglycerides 132, HDL 51, LDL direct 95, non-HDL 102. Sodium 139, potassium 4, chloride 103, bicarb 28, BUN 17, creatinine 0.73. AST 13, ALT 13, alkaline phosphatase 46 TSH 0.88 WBC 12.5, hemoglobin 13.8, hematocrit 39.8%, platelets 324  External Labs: Collected: Dec 02, 2022 [facility] database. Total cholesterol 122, triglycerides 139, HDL 48, LDL 61  Physical Exam:    Today's Vitals   01/26/24 1104  BP: 130/88  Pulse: 89  Resp: 16  SpO2: 96%  Weight: 254 lb 9.6 oz (115.5 kg)  Height: 5' 8 (1.727 m)   Body mass index is 38.71 kg/m. Wt Readings from Last 3 Encounters:  01/26/24 254 lb 9.6 oz (115.5 kg)  11/02/22 263 lb 9.6 oz (119.6 kg)  08/05/21 251 lb 12.8 oz (114.2 kg)    Physical Exam  Constitutional: No distress.  Age appropriate, hemodynamically stable.   Neck: No JVD present.  Cardiovascular: Normal rate, regular rhythm, S1 normal, S2 normal, intact distal pulses and normal pulses. Exam reveals no gallop, no S3 and no S4.  No murmur heard. Pulses:      Dorsalis pedis pulses are 2+ on the right side and 2+ on the left side.       Posterior tibial pulses are 2+ on the right side and 2+ on the left side.  Pulmonary/Chest: Effort normal and breath sounds normal. No stridor. She has no wheezes. She has no rales.  Abdominal: Soft. Bowel sounds are normal. She exhibits no distension. There is no abdominal tenderness.  Musculoskeletal:        General: No edema.     Cervical back: Neck supple.  Neurological: She is alert and oriented to person, place, and time. She has intact cranial nerves (2-12).   Skin: Skin is warm and moist.     Impression & Recommendation(s):  Impression:   ICD-10-CM   1. Nonobstructive atherosclerosis of coronary artery  I25.10 EKG 12-Lead    2. Coronary atherosclerosis due to calcified coronary lesion  I25.10    I25.84     3. Palpitations  R00.2     4. Benign hypertension  I10     5. Former smoker  Z87.891     6. Class 2 severe obesity due to excess calories with serious comorbidity and body mass index (BMI) of 38.0 to 38.9 in adult [facility])  Z33.187    E66.01    Z68.38        Recommendation(s):  Nonobstructive atherosclerosis of coronary artery Coronary atherosclerosis due to calcified coronary lesion Total CAC 39, 90th percentile. CAD RADS 3 as of October 2021. Continue aspirin  81 mg p.o. daily. Continue Lipitor 20 mg p.o. nightly.  LDL levels have reduced to 61 mg/dL Reemphasized the importance of secondary prevention with focus on improving the modifiable cardiovascular risk factors such as glycemic control, lipid management, blood pressure control, weight loss. No additional testing is warranted at this time.  Palpitations Asymptomatic Has bee off of Toprol  XL 50mg  po qday since June 2025 and has no more palpitations.  Shared decision was to hold off on beta blockers  as  her symptoms are no longer present.   Benign hypertension Office blood pressures are well-controlled. Continue hydrochlorothiazide 25 mg p.o. daily  Former smoker Reemphasized importance of complete cessation.  Class 2 obesity due to excess calories with serious comorbidity and body mass index (BMI) of 38.0 to 38.9 in adult Body mass index is 38.71 kg/m. I reviewed with her importance of diet, regular physical activity/exercise, weight loss.   Patient is educated on the importance of increasing physical activity gradually as tolerated with a goal of moderate intensity exercise for 30 minutes a day 5 days a week.    Orders Placed:  Orders Placed This Encounter   Procedures   EKG 12-Lead     Final Medication List:   No orders of the defined types were placed in this encounter.   Medications Discontinued During This Encounter  Medication Reason   metoprolol  succinate (TOPROL -XL) 50 MG 24 hr tablet Discontinued by provider     Current Outpatient Medications:    Ascorbic Acid (VITAMIN C) 100 MG tablet, Take 100 mg by mouth daily., Disp: , Rfl:    aspirin  EC 81 MG tablet, Take 1 tablet (81 mg total) by mouth daily. Swallow whole., Disp: 90 tablet, Rfl: 3   atorvastatin  (LIPITOR) 20 MG tablet, TAKE 1 TABLET BY MOUTH AT BEDTIME, Disp: 90 tablet, Rfl: 3   Ergocalciferol 10 MCG (400 UNIT) TABS, Take by mouth., Disp: , Rfl:    fluticasone (FLONASE) 50 MCG/ACT nasal spray, Place 1 spray into both nostrils daily., Disp: , Rfl:    hydrochlorothiazide (HYDRODIURIL) 25 MG tablet, TAKE 1 TABLET BY MOUTH ONCE DAILY NEED  OFFICE  VISIT, Disp: , Rfl: 0   ibuprofen (ADVIL) 800 MG tablet, Take 800 mg by mouth every 8 (eight) hours as needed. For cramps, Disp: , Rfl:    LORazepam (ATIVAN) 0.5 MG tablet, Take 1 tablet by mouth as needed., Disp: , Rfl:    Melatonin 5 MG CAPS, Take 1 capsule by mouth at bedtime., Disp: , Rfl:    traMADol (ULTRAM) 50 MG tablet, Take 50 mg by mouth in the morning, at noon, and at bedtime. , Disp: , Rfl:    venlafaxine XR (EFFEXOR-XR) 37.5 MG 24 hr capsule, Take 37.5 mg by mouth daily with breakfast., Disp: , Rfl:   Consent:   NA  Disposition:   2-year follow-up sooner if needed  Her questions and concerns were addressed to her satisfaction. She voices understanding of the recommendations provided during this encounter.    Signed, Madonna Michele HAS, [facility]  A Division of  [facility] [address] [place], [place] [address]  [place], [place] [id number]

## 2024-01-26 NOTE — Patient Instructions (Addendum)
 Medication Instructions:  DISCONTINUED metoprolol  succinate (toprol  XL)   *If you need a refill on your cardiac medications before your next appointment, please call your pharmacy*  Follow-Up: At [facility], you and your health needs are our priority.  As part of our continuing mission to provide you with exceptional heart care, our providers are all part of one team.  This team includes your primary Cardiologist (physician) and Advanced Practice Providers or APPs (Physician Assistants and Nurse Practitioners) who all work together to provide you with the care you need, when you need it.  Your next appointment:   2 year(s)  Provider:   Madonna Large, DO    We recommend signing up for the patient portal called MyChart.  Sign up information is provided on this After Visit Summary.  MyChart is used to connect with patients for Virtual Visits (Telemedicine).  Patients are able to view lab/test results, encounter notes, upcoming appointments, etc.  Non-urgent messages can be sent to your provider as well.   To learn more about what you can do with MyChart, go to [email or web address].

## 2024-01-28 ENCOUNTER — Encounter: Payer: Self-pay | Admitting: Cardiology

## 2024-02-22 ENCOUNTER — Encounter: Payer: Self-pay | Admitting: Cardiology

## 2024-02-23 NOTE — Telephone Encounter (Signed)
 I have been seeing her for coronary artery calcification and mild nonobstructive coronary disease.  At the last office visit recommended 2 year follow-up.  Would recommend that she follows up with PCP for blood pressure medication titration.  Looking at her trend her morning blood pressures are elevated, recommend being evaluated for sleep apnea can talk further w/ PCP.  If she is unable to get into her PCP in a timely fashion.  Would recommend taking hydrochlorothiazide in the morning.  And can start amlodipine 5 mg p.o. every afternoon until she gets a chance to see PCP.  Please make sure that the prescription refill gets sent to her PCP going forward.  Kahlie Deutscher Ong, DO, FACC
